# Patient Record
Sex: Female | Born: 1955 | Race: Black or African American | Hispanic: No | Marital: Married | Smoking: Current every day smoker
Health system: Southern US, Community
[De-identification: ages and names within clinical notes are randomized; demographics above are authoritative.]

## PROBLEM LIST (undated history)

## (undated) DIAGNOSIS — I10 Essential (primary) hypertension: Secondary | ICD-10-CM

## (undated) DIAGNOSIS — F17200 Nicotine dependence, unspecified, uncomplicated: Secondary | ICD-10-CM

## (undated) DIAGNOSIS — E669 Obesity, unspecified: Secondary | ICD-10-CM

## (undated) DIAGNOSIS — E785 Hyperlipidemia, unspecified: Secondary | ICD-10-CM

## (undated) HISTORY — DX: Obesity, unspecified: E66.9

## (undated) HISTORY — DX: Nicotine dependence, unspecified, uncomplicated: F17.200

## (undated) HISTORY — DX: Hyperlipidemia, unspecified: E78.5

## (undated) HISTORY — DX: Essential (primary) hypertension: I10

---

## 1976-03-01 DIAGNOSIS — F17200 Nicotine dependence, unspecified, uncomplicated: Secondary | ICD-10-CM

## 1976-03-01 HISTORY — DX: Nicotine dependence, unspecified, uncomplicated: F17.200

## 1991-03-02 DIAGNOSIS — N189 Chronic kidney disease, unspecified: Secondary | ICD-10-CM

## 1991-03-02 DIAGNOSIS — I10 Essential (primary) hypertension: Secondary | ICD-10-CM

## 1991-03-02 HISTORY — DX: Essential (primary) hypertension: I10

## 1991-03-02 HISTORY — DX: Chronic kidney disease, unspecified: N18.9

## 2000-03-01 HISTORY — PX: PARTIAL HYSTERECTOMY: SHX80

## 2001-08-14 ENCOUNTER — Other Ambulatory Visit: Admission: RE | Admit: 2001-08-14 | Discharge: 2001-08-14 | Payer: Self-pay | Admitting: Unknown Physician Specialty

## 2004-05-07 ENCOUNTER — Ambulatory Visit: Payer: Self-pay | Admitting: Family Medicine

## 2004-06-15 ENCOUNTER — Ambulatory Visit: Payer: Self-pay | Admitting: Family Medicine

## 2004-07-06 ENCOUNTER — Ambulatory Visit: Payer: Self-pay | Admitting: Family Medicine

## 2005-12-23 ENCOUNTER — Ambulatory Visit: Payer: Self-pay | Admitting: Family Medicine

## 2006-07-20 ENCOUNTER — Ambulatory Visit: Payer: Self-pay | Admitting: Family Medicine

## 2006-08-31 ENCOUNTER — Ambulatory Visit: Payer: Self-pay | Admitting: Family Medicine

## 2007-01-09 ENCOUNTER — Ambulatory Visit: Payer: Self-pay | Admitting: Family Medicine

## 2007-02-28 ENCOUNTER — Ambulatory Visit: Payer: Self-pay | Admitting: Family Medicine

## 2007-07-03 ENCOUNTER — Ambulatory Visit: Payer: Self-pay | Admitting: Family Medicine

## 2007-07-07 DIAGNOSIS — F17200 Nicotine dependence, unspecified, uncomplicated: Secondary | ICD-10-CM | POA: Insufficient documentation

## 2007-07-07 DIAGNOSIS — E663 Overweight: Secondary | ICD-10-CM | POA: Insufficient documentation

## 2007-07-07 DIAGNOSIS — I10 Essential (primary) hypertension: Secondary | ICD-10-CM | POA: Insufficient documentation

## 2007-07-07 DIAGNOSIS — I129 Hypertensive chronic kidney disease with stage 1 through stage 4 chronic kidney disease, or unspecified chronic kidney disease: Secondary | ICD-10-CM | POA: Insufficient documentation

## 2008-01-17 ENCOUNTER — Ambulatory Visit: Payer: Self-pay | Admitting: Family Medicine

## 2008-01-17 DIAGNOSIS — R5381 Other malaise: Secondary | ICD-10-CM | POA: Insufficient documentation

## 2008-01-17 DIAGNOSIS — R5383 Other fatigue: Secondary | ICD-10-CM

## 2008-04-12 ENCOUNTER — Encounter: Payer: Self-pay | Admitting: Family Medicine

## 2008-08-29 ENCOUNTER — Ambulatory Visit: Payer: Self-pay | Admitting: Family Medicine

## 2008-08-29 DIAGNOSIS — B369 Superficial mycosis, unspecified: Secondary | ICD-10-CM | POA: Insufficient documentation

## 2008-09-09 ENCOUNTER — Encounter: Payer: Self-pay | Admitting: Family Medicine

## 2008-09-10 ENCOUNTER — Encounter: Payer: Self-pay | Admitting: Family Medicine

## 2008-10-01 ENCOUNTER — Ambulatory Visit: Payer: Self-pay | Admitting: Family Medicine

## 2008-10-31 ENCOUNTER — Telehealth: Payer: Self-pay | Admitting: Family Medicine

## 2008-12-16 ENCOUNTER — Ambulatory Visit: Payer: Self-pay | Admitting: Family Medicine

## 2009-03-03 ENCOUNTER — Telehealth: Payer: Self-pay | Admitting: Family Medicine

## 2009-06-02 ENCOUNTER — Ambulatory Visit: Payer: Self-pay | Admitting: Family Medicine

## 2009-06-02 DIAGNOSIS — G5600 Carpal tunnel syndrome, unspecified upper limb: Secondary | ICD-10-CM | POA: Insufficient documentation

## 2009-09-08 ENCOUNTER — Ambulatory Visit: Payer: Self-pay | Admitting: Family Medicine

## 2010-01-01 ENCOUNTER — Ambulatory Visit: Payer: Self-pay | Admitting: Family Medicine

## 2010-01-01 DIAGNOSIS — M6530 Trigger finger, unspecified finger: Secondary | ICD-10-CM | POA: Insufficient documentation

## 2010-01-01 DIAGNOSIS — M79609 Pain in unspecified limb: Secondary | ICD-10-CM | POA: Insufficient documentation

## 2010-01-11 ENCOUNTER — Telehealth: Payer: Self-pay | Admitting: Family Medicine

## 2010-01-13 ENCOUNTER — Encounter: Payer: Self-pay | Admitting: Family Medicine

## 2010-03-22 ENCOUNTER — Encounter: Payer: Self-pay | Admitting: Family Medicine

## 2010-03-22 ENCOUNTER — Encounter: Payer: Self-pay | Admitting: General Surgery

## 2010-03-31 NOTE — Assessment & Plan Note (Signed)
Summary: OV   Vital Signs:  Patient profile:   55 year old female Menstrual status:  postmenopausal Height:      63 inches Weight:      185.75 pounds BMI:     33.02 O2 Sat:      97 % Pulse rate:   106 / minute Pulse rhythm:   regular Resp:     16 per minute BP sitting:   110 / 80  (left arm) Cuff size:   large  Vitals Entered By: Kate Sable LPN (July 11, 624THL [id number] AM)  Nutrition Counseling: Patient's BMI is greater than 25 and therefore counseled on weight management options. CC: Follow up chronic problems   CC:  Follow up chronic problems.  History of Present Illness: Reports  that she has been  doing well. Denies recent fever or chills. Denies sinus pressure, nasal congestion , ear pain or sore throat. Denies chest congestion, or cough productive of sputum. Denies chest pain, palpitations, PND, orthopnea or leg swelling. Denies abdominal pain, nausea, vomitting, diarrhea or constipation. Denies change in bowel movements or bloody stool. Denies dysuria , frequency, incontinence or hesitancy. Denies  joint pain, swelling, or reduced mobility. Denies headaches, vertigo, seizures. Denies depression, anxiety or insomnia. Denies  rash, lesions, or itch. she exercises 3 days per week, she is not as diligent with her diet , and misses the phentermine, she wants to resume it. she smokes on avg 3 ciggs daily     Preventive Screening-Counseling & Management  Alcohol-Tobacco     Smoking Cessation Counseling: yes  Current Medications (verified): 1)  Norvasc 10 Mg  Tabs (Amlodipine Besylate) .... Take 1 Tablet By Mouth Once A Day 2)  Maxzide 75-50 Mg  Tabs (Triamterene-Hctz) .... Take 1 Tablet By Mouth Once A Day 3)  Benazepril Hcl 20 Mg Tabs (Benazepril Hcl) .... Take 1 Tablet By Mouth Once A Day  Allergies (verified): 1)  ! Ace Inhibitors  Review of Systems      See HPI General:  Complains of fatigue; denies chills and fever. Eyes:  Complains of vision loss-both  eyes; denies discharge, eye pain, and red eye; wears corrective lenses. Endo:  Denies cold intolerance, excessive hunger, excessive thirst, excessive urination, and heat intolerance. Heme:  Denies abnormal bruising and bleeding. Allergy:  Denies hives or rash and itching eyes.  Physical Exam  General:  Well-developed,well-nourished,in no acute distress; alert,appropriate and cooperative throughout examination HEENT: No facial asymmetry,  EOMI, No sinus tenderness, TM's Clear, oropharynx  pink and moist.   Chest: Clear to auscultation bilaterally.  CVS: S1, S2, No murmurs, No S3.   Abd: Soft, Nontender.  MS: Adequate ROM spine, hips, shoulders and knees.POSITIVE TINNEL'S SIGN, THENAR WASTING BILATERALLY   Ext: No edema.   CNS: CN 2-12 intact, power tone and sensation normal throughout.   Skin: Intact, no visible lesions or rashes.  Psych: Good eye contact, normal affect.  Memory intact, not anxious or depressed appearing.     Impression & Recommendations:  Problem # 1:  NICOTINE ADDICTION (ICD-305.1) Assessment Unchanged  Encouraged smoking cessation and discussed different methods for smoking cessation.   Problem # 2:  HYPERTENSION (ICD-401.9) Assessment: Unchanged  The following medications were removed from the medication list:    Clonidine Hcl 0.1 Mg Tabs (Clonidine hcl) .Marland Kitchen... Take 1 tab by mouth at bedtime Her updated medication list for this problem includes:    Norvasc 10 Mg Tabs (Amlodipine besylate) .Marland Kitchen... Take 1 tablet by mouth once a day  Maxzide 75-50 Mg Tabs (Triamterene-hctz) .Marland Kitchen... Take 1 tablet by mouth once a day    Benazepril Hcl 20 Mg Tabs (Benazepril hcl) .Marland Kitchen... Take 1 tablet by mouth once a day  Orders: T-Basic Metabolic Panel ([id number])  BP today: 110/80 Prior BP: 114/80 (06/02/2009)  Problem # 3:  OBESITY (ICD-278.00) Assessment: Deteriorated  Ht: 63 (09/08/2009)   Wt: 185.75 (09/08/2009)   BMI: 33.02 (09/08/2009), pt to start phentermine daily  and inc physical activity  Complete Medication List: 1)  Norvasc 10 Mg Tabs (Amlodipine besylate) .... Take 1 tablet by mouth once a day 2)  Maxzide 75-50 Mg Tabs (Triamterene-hctz) .... Take 1 tablet by mouth once a day 3)  Benazepril Hcl 20 Mg Tabs (Benazepril hcl) .... Take 1 tablet by mouth once a day 4)  Phentermine Hcl 37.5 Mg Tabs (Phentermine hcl) .... Take 1 tablet by mouth once a day  Other Orders: T-Lipid Profile [id number]) T-CBC w/Diff [id number]) Radiology Referral (Radiology)  Patient Instructions: 1)  Please schedule a follow-up appointment in 2 months. 2)  Tobacco is very bad for your health and your loved ones! You Should stop smoking!. 3)  Stop Smoking Tips: Choose a Quit date. Cut down before the Quit date. decide what you will do as a substitute when you feel the urge to smoke(gum,toothpick,exercise).Max of 2 ciggs/day 4)  It is important that you exercise regularly at least 30 minutes 5 times a week. If you develop chest pain, have severe difficulty breathing, or feel very tired , stop exercising immediately and seek medical attention. 5)  You need to lose weight. Consider a lower calorie diet and regular exercise. Five pound weight loss 6)  BMP prior to visit, ICD-9: 7)  Lipid Panel prior to visit, ICD-9:  fasting as soon as possible 8)  CBC w/ Diff prior to visit, ICD-9: 9)  We will schedule your mamo. 10)  Pls sched your colonscopy 11)  Your blood pressure is excerllebnt  Prescriptions: BENAZEPRIL HCL 20 MG TABS (BENAZEPRIL HCL) Take 1 tablet by mouth once a day  #30 x 4   Entered by:   Kate Sable LPN   Authorized by:   Tula Nakayama MD   Signed by:   Kate Sable LPN on D34-534   Method used:   Electronically to        [facility] (retail)       [place], [place]  [address]       Ph: [phone] or [phone]       Fax: [phone]   RxID:   [id number] MAXZIDE 75-50 MG  TABS  (TRIAMTERENE-HCTZ) Take 1 tablet by mouth once a day  #30 x 4   Entered by:   Kate Sable LPN   Authorized by:   Tula Nakayama MD   Signed by:   Kate Sable LPN on D34-534   Method used:   Electronically to        [facility] (retail)       [address]       [place], [place]  [address]       Ph: [phone] or [phone]       Fax: [phone]   RxID:   [id number] NORVASC 10 MG  TABS (AMLODIPINE BESYLATE) Take 1 tablet by mouth once a day  #30 x 4  Entered by:   Kate Sable LPN   Authorized by:   Tula Nakayama MD   Signed by:   Kate Sable LPN on D34-534   Method used:   Electronically to        [facility] (retail)       [address]       [place], [place]  [address]       Ph: [phone] or [phone]       Fax: [phone]   RxID:   [id number] PHENTERMINE HCL 37.5 MG TABS (PHENTERMINE HCL) Take 1 tablet by mouth once a day  #30 x 1   Entered and Authorized by:   Tula Nakayama MD   Signed by:   Tula Nakayama MD on 09/08/2009   Method used:   Printed then faxed to ...       [facility] (retail)       [address]       [place], [place]  [address]       Ph: [phone] or [phone]       Fax: [phone]   RxID:   [id number]

## 2010-03-31 NOTE — Assessment & Plan Note (Signed)
Summary: office visit   Vital Signs:  Patient profile:   55 year old female Menstrual status:  postmenopausal Height:      63 inches Weight:      186.75 pounds BMI:     33.20 O2 Sat:      98 % on Room air Pulse rate:   92 / minute Pulse rhythm:   regular Resp:     16 per minute BP sitting:   122 / 84  (right arm)  Vitals Entered By: Louie Casa CMA (January 01, 2010 8:56 AM)  Nutrition Counseling: Patient's BMI is greater than 25 and therefore counseled on weight management options.  O2 Flow:  Room air CC: Patient states right thumb hurts for past 6 months, she thinks it is carpal tunnel. Fatigued   CC:  Patient states right thumb hurts for past 6 months and she thinks it is carpal tunnel. Fatigued.  History of Present Illness: Reports  that she has not been doing very well. She reports chronic fatigue She is not exercising regularly and therefore her weight is unchanged. Denies recent fever or chills. Denies sinus pressure, nasal congestion , ear pain or sore throat. Denies chest congestion, or cough productive of sputum. Denies chest pain, palpitations, PND, orthopnea or leg swelling. Denies abdominal pain, nausea, vomitting, diarrhea or constipation. Denies change in bowel movements or bloody stool. Denies dysuria , frequency, incontinence or hesitancy.  Denies headaches, vertigo, seizures. Denies depression, anxiety or insomnia. Denies  rash, lesions, or itch. she is still smoking though a little less, still wants to quit.     Current Medications (verified): 1)  Norvasc 10 Mg  Tabs (Amlodipine Besylate) .... Take 1 Tablet By Mouth Once A Day 2)  Maxzide 75-50 Mg  Tabs (Triamterene-Hctz) .... Take 1 Tablet By Mouth Once A Day 3)  Benazepril Hcl 20 Mg Tabs (Benazepril Hcl) .... Take 1 Tablet By Mouth Once A Day 4)  Phentermine Hcl 37.5 Mg Tabs (Phentermine Hcl) .... Take 1 Tablet By Mouth Once A Day  Allergies (verified): 1)  ! Ace Inhibitors  Review of  Systems      See HPI General:  Complains of fatigue and malaise. Eyes:  Complains of vision loss-both eyes; denies discharge and red eye. MS:  Complains of joint pain and stiffness; right thumb pain and tenderness and swelling with decreased mobility, also has reduced strength in the hands from carpal tunnel . Endo:  Denies cold intolerance, excessive thirst, excessive urination, and heat intolerance. Heme:  Denies abnormal bruising and bleeding. Allergy:  Complains of seasonal allergies.  Physical Exam  General:  Well-developed,well-nourished,in no acute distress; alert,appropriate and cooperative throughout examination HEENT: No facial asymmetry,  EOMI, No sinus tenderness, TM's Clear, oropharynx  pink and moist.   Chest: Clear to auscultation bilaterally.  CVS: S1, S2, No murmurs, No S3.   Abd: Soft, Nontender.  MS: Adequate ROM spine, hips, shoulders and knees.POSITIVE TINNEL'S SIGN, THENAR WASTING BILATERALLY  . Right thenar eminence tender with selling and reduced mobility of the thumb which also triggers Ext: No edema.   CNS: CN 2-12 intact, power tone and sensation normal throughout.   Skin: Intact, no visible lesions or rashes.  Psych: Good eye contact, normal affect.  Memory intact, not anxious or depressed appearing.     Impression & Recommendations:  Problem # 1:  TRIGGER FINGER, RIGHT THUMB (ICD-727.03) Assessment Deteriorated  Orders: Orthopedic Referral (Ortho)  Problem # 2:  CARPAL TUNNEL SYNDROME (ICD-354.0) Assessment: Deteriorated  Problem #  3:  NICOTINE ADDICTION (ICD-305.1) Assessment: Unchanged  Encouraged smoking cessation and discussed different methods for smoking cessation.   Problem # 4:  HYPERTENSION (ICD-401.9) Assessment: Unchanged  The following medications were removed from the medication list:    Maxzide 75-50 Mg Tabs (Triamterene-hctz) .Marland Kitchen... Take 1 tablet by mouth once a day Her updated medication list for this problem includes:     Norvasc 10 Mg Tabs (Amlodipine besylate) .Marland Kitchen... Take 1 tablet by mouth once a day    Benazepril Hcl 20 Mg Tabs (Benazepril hcl) .Marland Kitchen... Take 1 tablet by mouth once a day  BP today: 122/84 Prior BP: 110/80 (09/08/2009)  Problem # 5:  OBESITY (ICD-278.00) Assessment: Unchanged  Ht: 63 (01/01/2010)   Wt: 186.75 (01/01/2010)   BMI: 33.20 (01/01/2010)  Complete Medication List: 1)  Norvasc 10 Mg Tabs (Amlodipine besylate) .... Take 1 tablet by mouth once a day 2)  Benazepril Hcl 20 Mg Tabs (Benazepril hcl) .... Take 1 tablet by mouth once a day 3)  Phentermine Hcl 37.5 Mg Tabs (Phentermine hcl) .... Take 1 tablet by mouth once a day 4)  Prednisone (pak) 5 Mg Tabs (Prednisone) .... Use as directed 5)  Ibuprofen 800 Mg Tabs (Ibuprofen) .... Take 1 tablet by mouth three times a day  Other Orders: Influenza Vaccine NON MCR [id number]) Depo- Medrol 80mg  (J1040) Ketorolac-Toradol 15mg  [id number]) Admin of Therapeutic Inj  intramuscular or subcutaneous [id number])  Patient Instructions: 1)  Please schedule a follow-up appointment in 4 months. 2)  It is important that you exercise regularly at least 30 minutes 5 times a week. If you develop chest pain, have severe difficulty breathing, or feel very tired , stop exercising immediately and seek medical attention. 3)  You need to lose weight. Consider a lower calorie diet and regular exercise. Goal is 8 pounds 4)  You will get injections for your right hand pain and meds have been sent in. 5)  Left ear flush today. 6)  You are referred to ortho re the thumb. 7)  Schedule a colonoscopy/sigmoidoscopy to help detect colon cancer. Prescriptions: IBUPROFEN 800 MG TABS (IBUPROFEN) Take 1 tablet by mouth three times a day  #21 x 0   Entered and Authorized by:   Tula Nakayama MD   Signed by:   Tula Nakayama MD on 01/01/2010   Method used:   Electronically to        [facility] (retail)       [address]       [place], [place]  [address]       Ph: [phone] or [phone]       Fax: [phone]   RxID:   [id number] PREDNISONE (PAK) 5 MG TABS (PREDNISONE) Use as directed  #21 x 0   Entered and Authorized by:   Tula Nakayama MD   Signed by:   Tula Nakayama MD on 01/01/2010   Method used:   Electronically to        [facility]* (retail)       [address]       [place], [place]  [address]       Ph: [phone] or [phone]       Fax: [phone]   RxID:   [id number] PHENTERMINE HCL 37.5 MG TABS (PHENTERMINE HCL) Take 1 tablet by mouth once a day  #30 x 1   Entered  and Authorized by:   Tula Nakayama MD   Signed by:   Tula Nakayama MD on 01/01/2010   Method used:   Printed then faxed to ...       [facility]* (retail)       [address]       [place], [place]  [address]       Ph: [phone] or [phone]       Fax: [phone]   RxID:   [id number]    Medication Administration  Injection # 1:    Medication: Depo- Medrol 80mg     Diagnosis: HAND PAIN, RIGHT (ICD-729.5)    Route: IM    Site: RUOQ gluteus    Exp Date: 07/12    Lot #: Marily Lente    Mfr: Pharmacia    Patient tolerated injection without complications    Given by: Baldomero Lamy LPN (November  3, 624THL 10:08 AM)  Injection # 2:    Medication: Ketorolac-Toradol 15mg     Diagnosis: HAND PAIN, RIGHT (ICD-729.5)    Route: IM    Site: LUOQ gluteus    Exp Date: 12/31/2010    Lot #: [id number]    Mfr: [email or web address]    Comments: toradol 60mg  given    Patient tolerated injection without complications    Given by: Baldomero Lamy LPN (November  3, 624THL 10:09 AM)  Orders Added: 1)  Influenza Vaccine NON MCR [00028] 2)  Est. Patient Level IV [id number] 3)  Depo- Medrol 80mg  [J1040] 4)  Ketorolac-Toradol 15mg  [J1885] 5)  Admin of Therapeutic Inj  intramuscular or subcutaneous [96372] 6)  Orthopedic Referral [Ortho] 7)   Orthopedic Referral [Ortho]   Immunizations Administered:  Influenza Vaccine:    Vaccine Type: Fluvax Non-MCR    Site: right deltoid    Mfr: novartis    Dose: 0.5 ml    Route: IM    Given by: Louie Casa CMA    Exp. Date: 06/2010    Lot #: [id number] 5p    VIS given: 09/23/09 version given January 01, 2010.   Immunizations Administered:  Influenza Vaccine:    Vaccine Type: Fluvax Non-MCR    Site: right deltoid    Mfr: novartis    Dose: 0.5 ml    Route: IM    Given by: Louie Casa CMA    Exp. Date: 06/2010    Lot #: [id number] 5p    VIS given: 09/23/09 version given January 01, 2010.   Medication Administration  Injection # 1:    Medication: Depo- Medrol 80mg     Diagnosis: HAND PAIN, RIGHT (ICD-729.5)    Route: IM    Site: RUOQ gluteus    Exp Date: 07/12    Lot #: Marily Lente    Mfr: Pharmacia    Patient tolerated injection without complications    Given by: Baldomero Lamy LPN (November  3, 624THL 10:08 AM)  Injection # 2:    Medication: Ketorolac-Toradol 15mg     Diagnosis: HAND PAIN, RIGHT (ICD-729.5)    Route: IM    Site: LUOQ gluteus    Exp Date: 12/31/2010    Lot #: [id number]    Mfr: novaplus    Comments: toradol 60mg  given    Patient tolerated injection without complications    Given by: Baldomero Lamy LPN (November  3, 624THL 10:09 AM)  Orders Added: 1)  Influenza Vaccine NON MCR [00028] 2)  Est. Patient Level IV [id number] 3)  Depo- Medrol  80mg  [J1040] 4)  Ketorolac-Toradol 15mg  [J1885] 5)  Admin of Therapeutic Inj  intramuscular or subcutaneous [96372] 6)  Orthopedic Referral [Ortho] 7)  Orthopedic Referral [Ortho]  attempted left ear irriagation, unsuccessful, patient advised to use wax softner before next visit Baldomero Lamy LPN  November  3, 624THL 10:10 AM

## 2010-03-31 NOTE — Progress Notes (Signed)
  Phone Note Other Incoming   Caller: dr simpson Summary of Call: Labs of 01/01/10 show elevated fBG, please advise the pt that HER fasting blood sugar is  higher than normal.  She needs  to cut back on carbs and sweets, start regular exercise, target at least 30 mins 5 days weekly, and aim for weight loss.  This will reduce the risk of becoming a diabetic  or delay it's development , she also needs HBA1C asap  Please advise pt the cholesterol is elevated. They need to increase fresh or frozen fruit and vegetable intake, reduce red meats max twice weekly, increase white meat, baked, grilled or broiled eg fish, [place] breast, chicken breast.  Egg white boiled is an excellent source of protein and beans cooked without butter or oil. Work towards exercising at least 5 days/week for 30 minutes each session, to improve the good cholesterol. Cut back on butter, cheese, oils and the yellow of eggs, preferably drink 1% or skimmed milk. The healthy oils are olive and canola, use in moderation; healthiest "butter" substitute is smart balance. , total chol 216, and LDL high also at 112, thes inc her risk of heRT DISEASE. SHE REALLY NEEDS TO QUIT SMOKING AND ENSURE HER BLOOD PRESSURE IS NORMAL Initial call taken by: Tula Nakayama MD,  January 11, 2010 5:40 PM    patient aware Baldomero Lamy LPN  November 15, 624THL 2:38 PM

## 2010-03-31 NOTE — Assessment & Plan Note (Signed)
Summary: FOLLOW UP   Vital Signs:  Patient profile:   55 year old female Menstrual status:  postmenopausal Height:      63 inches Weight:      179 pounds BMI:     31.82 O2 Sat:      96 % Pulse rate:   65 / minute Pulse rhythm:   regular Resp:     16 per minute BP sitting:   114 / 80  (left arm) Cuff size:   large  Vitals Entered By: Kate Sable LPN (April  4, 624THL D34-534 AM)  Nutrition Counseling: Patient's BMI is greater than 25 and therefore counseled on weight management options. CC: has been having a bad ace cough, no energy at all   CC:  has been having a bad ace cough and no energy at all.  History of Present Illness: Reports  that she hs been  doing well. She does c/o seveerre bilateral hand pain with weakness. Denies recent fever or chills. Denies sinus pressure, nasal congestion , ear pain or sore throat. Denies chest congestion,c/o chronic cough , non productive , with tickle in throat, likely due to ace. Denies chest pain, palpitations, PND, orthopnea or leg swelling. Denies abdominal pain, nausea, vomitting, diarrhea or constipation. Denies change in bowel movements or bloody stool. Denies dysuria , frequency, incontinence or hesitancy. Denies  joint pain, swelling, or reduced mobility. Denies headaches, vertigo, seizures. Denies depression, anxiety or insomnia. Denies  rash, lesions, or itch. She is still smoking, but waNTS TO QUIT.      Preventive Screening-Counseling & Management  Alcohol-Tobacco     Smoking Cessation Counseling: yes  Current Medications (verified): 1)  Norvasc 10 Mg  Tabs (Amlodipine Besylate) .... Take 1 Tablet By Mouth Once A Day 2)  Maxzide 75-50 Mg  Tabs (Triamterene-Hctz) .... Take 1 Tablet By Mouth Once A Day 3)  Clotrimazole-Betamethasone 1-0.05 % Crea (Clotrimazole-Betamethasone) .... Apply To Affected Areas Twice Daily As Needed  Allergies (verified): 1)  ! Ace Inhibitors  Past History:  Past Medical  History: NICOTINE ADDICTION (ICD-305.1) OBESITY (ICD-278.00) HYPERTENSION (ICD-401.9) hyperlipidemioa  Family History:  MOTHER DECEASED BREAST CANCER / HYPERTENSION / STROKE FATHER  DECEASED / HYPERTENSION / STROKE /  DIABETIC THREE BROTHERS LIVING / two DIABETIC / THREE HYPERTENSION  Social History: HAIRDRESSER Married Current Smoker, current 2 to 4 per day Alcohol use-yes, cutting back Drug use-no One child adult, healthy  Review of Systems      See HPI Eyes:  Denies blurring and discharge. MS:  Complains of joint pain, muscle weakness, and stiffness. Heme:  Denies abnormal bruising and bleeding. Allergy:  Complains of seasonal allergies.  Physical Exam  General:  Well-developed,well-nourished,in no acute distress; alert,appropriate and cooperative throughout examination HEENT: No facial asymmetry,  EOMI, No sinus tenderness, TM's Clear, oropharynx  pink and moist.   Chest: Clear to auscultation bilaterally.  CVS: S1, S2, No murmurs, No S3.   Abd: Soft, Nontender.  MS: Adequate ROM spine, hips, shoulders and knees.POSITIVE TINNEL'S SIGN, THENAR WASTING BILATERALLY   Ext: No edema.   CNS: CN 2-12 intact, power tone and sensation normal throughout.   Skin: Intact, no visible lesions or rashes.  Psych: Good eye contact, normal affect.  Memory intact, not anxious or depressed appearing.     Impression & Recommendations:  Problem # 1:  CARPAL TUNNEL SYNDROME (ICD-354.0) Assessment Deteriorated  Orders: Neurology Referral (Neuro)  Problem # 2:  DERMATOMYCOSIS (ICD-111.9) Assessment: Improved  Her updated medication list for this problem  includes:    Clotrimazole-betamethasone 1-0.05 % Crea (Clotrimazole-betamethasone) .Marland Kitchen... Apply to affected areas twice daily as needed  Problem # 3:  NICOTINE ADDICTION (ICD-305.1) Assessment: Unchanged  Encouraged smoking cessation and discussed different methods for smoking cessation.   Problem # 4:  HYPERTENSION  (ICD-401.9) Assessment: Unchanged  The following medications were removed from the medication list:    Lotensin 20 Mg Tabs (Benazepril hcl) .Marland Kitchen... Take 1 tablet by mouth once a day Her updated medication list for this problem includes:    Norvasc 10 Mg Tabs (Amlodipine besylate) .Marland Kitchen... Take 1 tablet by mouth once a day    Maxzide 75-50 Mg Tabs (Triamterene-hctz) .Marland Kitchen... Take 1 tablet by mouth once a day    Clonidine Hcl 0.1 Mg Tabs (Clonidine hcl) .Marland Kitchen... Take 1 tab by mouth at bedtime, med changed due to cough likely from ace intolerance  Orders: T-Basic Metabolic Panel (99991111)  BP today: 114/80 Prior BP: 112/82 (12/16/2008)  Problem # 5:  OBESITY (ICD-278.00) Assessment: Unchanged  Ht: 63 (06/02/2009)   Wt: 179 (06/02/2009)   BMI: 31.82 (06/02/2009)  Complete Medication List: 1)  Norvasc 10 Mg Tabs (Amlodipine besylate) .... Take 1 tablet by mouth once a day 2)  Maxzide 75-50 Mg Tabs (Triamterene-hctz) .... Take 1 tablet by mouth once a day 3)  Clotrimazole-betamethasone 1-0.05 % Crea (Clotrimazole-betamethasone) .... Apply to affected areas twice daily as needed 4)  Clonidine Hcl 0.1 Mg Tabs (Clonidine hcl) .... Take 1 tab by mouth at bedtime 5)  Phentermine Hcl 37.5 Mg Caps (Phentermine hcl) .... Take 1 tablet by mouth once a day  Other Orders: T-Hepatic Function [id number]) T-Lipid Profile [id number])  Patient Instructions: 1)  Please schedule a follow-up appointment in 3 months. 2)  Tobacco is very bad for your health and your loved ones! You Should stop smoking!. 3)  Stop Smoking Tips: Choose a Quit date. Cut down before the Quit date. decide what you will do as a substitute when you feel the urge to smoke(gum,toothpick,exercise). 4)  It is important that you exercise regularly at least 20 minutes 5 times a week. If you develop chest pain, have severe difficulty breathing, or feel very tired , stop exercising immediately and seek medical attention. 5)  You need to lose  weight. Consider a lower calorie diet and regular exercise.  6)  BMP prior to visit, ICD-9: 7)  Hepatic Panel prior to visit, ICD-9: fasting asap 8)  Lipid Panel prior to visit, ICD-9: 9)  you will be referred  for nerve conduction tests for carpal tunnel synd and to Dr. Merlene Laughter  for injections Prescriptions: CLOTRIMAZOLE-BETAMETHASONE 1-0.05 % CREA (CLOTRIMAZOLE-BETAMETHASONE) apply to affected areas twice daily as needed  #30 gm x 2   Entered by:   Kate Sable LPN   Authorized by:   Tula Nakayama MD   Signed by:   Kate Sable LPN on QA348G   Method used:   Electronically to        [facility] (retail)       [place], [place]  [address]       Ph: [phone] or [phone]       Fax: [phone]   RxID:   [id number] PHENTERMINE HCL 37.5 MG CAPS (PHENTERMINE HCL) Take 1 tablet by mouth once a day  #30 x 1   Entered by:   Kate Sable LPN   Authorized by:   Joycelyn Schmid  Simpson MD   Signed by:   Kate Sable LPN on QA348G   Method used:   Printed then faxed to ...       [facility] (retail)       [address]       [place], [place]  [address]       Ph: [phone] or [phone]       Fax: [phone]   RxID:   [id number] CLONIDINE HCL 0.1 MG TABS (CLONIDINE HCL) Take 1 tab by mouth at bedtime  #30 x 4   Entered and Authorized by:   Tula Nakayama MD   Signed by:   Tula Nakayama MD on 06/02/2009   Method used:   Electronically to        [facility] (retail)       [address]       [place],   [address]       Ph: [phone] or [phone]       Fax: [phone]   RxID:   [id number]

## 2010-03-31 NOTE — Progress Notes (Signed)
Summary: BP MEDS  Phone Note Call from Patient   Summary of Call: NEEDS HER BP MEDS FAXED BACK IN ASAP CLOSE TODAY AT 5:30 OR 6:00 [place] Initial call taken by: Dierdre Harness,  March 03, 2009 4:29 PM    Prescriptions: NORVASC 10 MG  TABS (AMLODIPINE BESYLATE) Take 1 tablet by mouth once a day  #30 x 4   Entered by:   Kate Sable   Authorized by:   Tula Nakayama MD   Signed by:   Kate Sable on 03/04/2009   Method used:   Electronically to        [facility] (retail)       [place], [place]  [address]       Ph: [phone] or [phone]       Fax: [phone]   RxID:   [id number] MAXZIDE 75-50 MG  TABS (TRIAMTERENE-HCTZ) Take 1 tablet by mouth once a day  #30 x 4   Entered by:   Kate Sable   Authorized by:   Tula Nakayama MD   Signed by:   Kate Sable on 03/04/2009   Method used:   Electronically to        [facility] (retail)       [place], [place]  [address]       Ph: [phone] or [phone]       Fax: [phone]   RxID:   [id number] LOTENSIN 20 MG TABS (BENAZEPRIL HCL) Take 1 tablet by mouth once a day  #30 x 4   Entered by:   Kate Sable   Authorized by:   Tula Nakayama MD   Signed by:   Kate Sable on 03/04/2009   Method used:   Electronically to        [facility] (retail)       [address]       [place], [place]  [address]       Ph: [phone] or [phone]       Fax: [phone]   RxID:   [id number]

## 2010-05-04 ENCOUNTER — Ambulatory Visit: Payer: Self-pay | Admitting: Family Medicine

## 2010-05-05 ENCOUNTER — Ambulatory Visit (INDEPENDENT_AMBULATORY_CARE_PROVIDER_SITE_OTHER): Payer: PRIVATE HEALTH INSURANCE | Admitting: Family Medicine

## 2010-05-05 ENCOUNTER — Other Ambulatory Visit: Payer: Self-pay | Admitting: Family Medicine

## 2010-05-05 ENCOUNTER — Encounter: Payer: Self-pay | Admitting: Family Medicine

## 2010-05-05 DIAGNOSIS — E669 Obesity, unspecified: Secondary | ICD-10-CM

## 2010-05-05 DIAGNOSIS — F17200 Nicotine dependence, unspecified, uncomplicated: Secondary | ICD-10-CM

## 2010-05-05 DIAGNOSIS — I10 Essential (primary) hypertension: Secondary | ICD-10-CM

## 2010-05-06 LAB — BASIC METABOLIC PANEL
CO2: 25 mEq/L (ref 19–32)
Chloride: 105 mEq/L (ref 96–112)
Creat: 1.87 mg/dL — ABNORMAL HIGH (ref 0.40–1.20)
Potassium: 3.9 mEq/L (ref 3.5–5.3)
Sodium: 141 mEq/L (ref 135–145)

## 2010-05-06 LAB — HEMOGLOBIN A1C: Mean Plasma Glucose: 128 mg/dL — ABNORMAL HIGH (ref <117)

## 2010-05-07 ENCOUNTER — Telehealth (INDEPENDENT_AMBULATORY_CARE_PROVIDER_SITE_OTHER): Payer: Self-pay | Admitting: *Deleted

## 2010-05-11 LAB — CONVERTED CEMR LAB
CO2: 25 meq/L (ref 19–32)
Chloride: 105 meq/L (ref 96–112)
Glucose, Bld: 110 mg/dL — ABNORMAL HIGH (ref 70–99)
Potassium: 3.9 meq/L (ref 3.5–5.3)
Sodium: 141 meq/L (ref 135–145)

## 2010-05-12 NOTE — Progress Notes (Signed)
Summary: referral  Phone Note Call from Patient   Summary of Call: pt has appt to get mammo at [facility] for 05/15/2010 9:00. let message for pt about appt and time. And if she had any questions to call office with them.  Initial call taken by: Lenn Cal,  May 07, 2010 2:33 PM

## 2010-05-19 NOTE — Assessment & Plan Note (Signed)
Summary: follow up   Vital Signs:  Patient profile:   55 year old female Menstrual status:  postmenopausal Height:      63 inches Weight:      180 pounds BMI:     32.00 O2 Sat:      96 % Pulse rate:   91 / minute Pulse rhythm:   regular Resp:     16 per minute BP sitting:   120 / 78  (left arm) Cuff size:   large  Vitals Entered By: Kate Sable LPN (March  6, [id number] PM)  Nutrition Counseling: Patient's BMI is greater than 25 and therefore counseled on weight management options. CC: Follow up chronic problems   CC:  Follow up chronic problems.  History of Present Illness: Reports  that she is doing well. She is losing weight and is more commited to exercise, feels good about the results so far Denies recent fever or chills. Denies sinus pressure, nasal congestion , ear pain or sore throat. Denies chest congestion, or cough productive of sputum. Denies chest pain, palpitations, PND, orthopnea or leg swelling. Denies abdominal pain, nausea, vomitting, diarrhea or constipation. Denies change in bowel movements or bloody stool. Denies dysuria , frequency, incontinence or hesitancy. Denies  joint pain, swelling, or reduced mobility. Denies headaches, vertigo, seizures. Denies depression, anxiety or insomnia. Denies  rash, lesions, or itch.     Preventive Screening-Counseling & Management  Alcohol-Tobacco     Smoking Cessation Counseling: yes  Current Medications (verified): 1)  Norvasc 10 Mg  Tabs (Amlodipine Besylate) .... Take 1 Tablet By Mouth Once A Day 2)  Benazepril Hcl 20 Mg Tabs (Benazepril Hcl) .... Take 1 Tablet By Mouth Once A Day 3)  Phentermine Hcl 37.5 Mg Tabs (Phentermine Hcl) .... Take 1 Tablet By Mouth Once A Day 4)  Ibuprofen 800 Mg Tabs (Ibuprofen) .... Take 1 Tablet By Mouth Three Times A Day 5)  Maxzide 75-50 Mg Tabs (Triamterene-Hctz) .... One Tab By Mouth Once Daily  Allergies (verified): 1)  ! Ace Inhibitors  Review of Systems      See  HPI Eyes:  Complains of vision loss-both eyes. Psych:  Denies anxiety and depression. Endo:  Denies cold intolerance, excessive hunger, excessive thirst, and excessive urination. Heme:  Denies abnormal bruising, bleeding, and enlarge lymph nodes. Allergy:  Complains of seasonal allergies.  Physical Exam  General:  Well-developed,well-nourished,in no acute distress; alert,appropriate and cooperative throughout examination HEENT: No facial asymmetry,  EOMI, No sinus tenderness, TM's Clear, oropharynx  pink and moist.   Chest: Clear to auscultation bilaterally.  CVS: S1, S2, No murmurs, No S3.   Abd: Soft, Nontender.  MS: Adequate ROM spine, hips, shoulders and knees.  Ext: No edema.   CNS: CN 2-12 intact, power tone and sensation normal throughout.   Skin: Intact, no visible lesions or rashes.  Psych: Good eye contact, normal affect.  Memory intact, not anxious or depressed appearing.    Impression & Recommendations:  Problem # 1:  NICOTINE ADDICTION (ICD-305.1) Assessment Unchanged  Encouraged smoking cessation and discussed different methods for smoking cessation.   Problem # 2:  HYPERTENSION (ICD-401.9) Assessment: Unchanged  Her updated medication list for this problem includes:    Norvasc 10 Mg Tabs (Amlodipine besylate) .Marland Kitchen... Take 1 tablet by mouth once a day    Benazepril Hcl 20 Mg Tabs (Benazepril hcl) .Marland Kitchen... Take 1 tablet by mouth once a day    Maxzide 75-50 Mg Tabs (Triamterene-hctz) ..... One tab by mouth  once daily  Orders: T-Basic Metabolic Panel (99991111) T-Basic Metabolic Panel (99991111)  BP today: 120/78 Prior BP: 122/84 (01/01/2010)  Problem # 3:  OBESITY (ICD-278.00) Assessment: Improved  Ht: 63 (05/05/2010)   Wt: 180 (05/05/2010)   BMI: 32.00 (05/05/2010) therapeutic lifestyle change discussed and encouraged pt to continue phentermine as before  Complete Medication List: 1)  Norvasc 10 Mg Tabs (Amlodipine besylate) .... Take 1 tablet by  mouth once a day 2)  Benazepril Hcl 20 Mg Tabs (Benazepril hcl) .... Take 1 tablet by mouth once a day 3)  Ibuprofen 800 Mg Tabs (Ibuprofen) .... Take 1 tablet by mouth three times a day 4)  Maxzide 75-50 Mg Tabs (Triamterene-hctz) .... One tab by mouth once daily 5)  Phentermine Hcl 37.5 Mg Tabs (Phentermine hcl) .... Take 1 tablet by mouth once a day  Other Orders: Radiology other (Radiology Other) T- Hemoglobin A1C [id number]) T-Lipid Profile [id number]) Radiology Referral (Radiology)  Patient Instructions: 1)  Please schedule a cPE  in 4 months. 2)  Tobacco is very bad for your health and your loved ones! You Should stop smoking!. 3)  Stop Smoking Tips: Choose a Quit date. Cut down before the Quit date. decide what you will do as a substitute when you feel the urge to smoke(gum,toothpick,exercise). 4)  It is important that you exercise regularly at least 60 minutes 5 times a week. If you develop chest pain, have severe difficulty breathing, or feel very tired , stop exercising immediately and seek medical attention. 5)  You need to lose weight. Consider a lower calorie diet and regular exercise.  6)  BMP prior to visit, ICD-9: 7)  HbgA1C prior to visit, ICD-9:   today 8)  BMP prior to visit, ICD-9: 9)  Lipid Panel prior to visit, ICD-9:  fasting in 4 months Prescriptions: PHENTERMINE HCL 37.5 MG TABS (PHENTERMINE HCL) Take 1 tablet by mouth once a day  #30 x 1   Entered and Authorized by:   Tula Nakayama MD   Signed by:   Tula Nakayama MD on 05/05/2010   Method used:   Printed then faxed to ...       [facility]* (retail)       [address]       [place], [place]  [address]       Ph: [phone] or [phone]       Fax: [phone]   RxID:   [id number]    Orders Added: 1)  Radiology other [Radiology Other] 2)  Est. Patient Level IV [id number] 3)  T-Basic Metabolic Panel 0000000 4)  T- Hemoglobin A1C  [83036-23375] 5)  T-Basic Metabolic Panel 0000000 6)  T-Lipid Profile [80061-22930] 7)  Radiology Referral [Radiology]

## 2010-05-30 ENCOUNTER — Other Ambulatory Visit: Payer: Self-pay | Admitting: Family Medicine

## 2010-07-07 ENCOUNTER — Other Ambulatory Visit: Payer: Self-pay | Admitting: Family Medicine

## 2010-07-07 DIAGNOSIS — I10 Essential (primary) hypertension: Secondary | ICD-10-CM

## 2010-07-08 MED ORDER — AMLODIPINE BESYLATE 10 MG PO TABS: 10.0000 mg | ORAL_TABLET | Freq: Every day | ORAL | Status: DC

## 2010-07-08 MED ORDER — BENAZEPRIL HCL 20 MG PO TABS: 20.0000 mg | ORAL_TABLET | Freq: Every day | ORAL | Status: DC

## 2010-07-09 ENCOUNTER — Other Ambulatory Visit: Payer: Self-pay

## 2010-07-09 DIAGNOSIS — I10 Essential (primary) hypertension: Secondary | ICD-10-CM

## 2010-07-09 MED ORDER — AMLODIPINE BESYLATE 10 MG PO TABS: 10.0000 mg | ORAL_TABLET | Freq: Every day | ORAL | Status: DC

## 2010-09-09 ENCOUNTER — Encounter: Payer: Self-pay | Admitting: Family Medicine

## 2010-09-15 ENCOUNTER — Encounter: Payer: Self-pay | Admitting: Family Medicine

## 2010-09-15 ENCOUNTER — Ambulatory Visit (INDEPENDENT_AMBULATORY_CARE_PROVIDER_SITE_OTHER): Payer: PRIVATE HEALTH INSURANCE | Admitting: Family Medicine

## 2010-09-15 ENCOUNTER — Other Ambulatory Visit (HOSPITAL_COMMUNITY)
Admission: RE | Admit: 2010-09-15 | Discharge: 2010-09-15 | Disposition: A | Discharge status: Home / Self Care | Payer: PRIVATE HEALTH INSURANCE | Source: Ambulatory Visit | Attending: Family Medicine | Admitting: Family Medicine

## 2010-09-15 VITALS — BP 130/90 | HR 72 | Resp 16 | Ht 63.75 in | Wt 179.1 lb

## 2010-09-15 DIAGNOSIS — R7301 Impaired fasting glucose: Secondary | ICD-10-CM

## 2010-09-15 DIAGNOSIS — Z01419 Encounter for gynecological examination (general) (routine) without abnormal findings: Secondary | ICD-10-CM | POA: Insufficient documentation

## 2010-09-15 DIAGNOSIS — Z0000 Encounter for general adult medical examination without abnormal findings: Secondary | ICD-10-CM

## 2010-09-15 DIAGNOSIS — R1031 Right lower quadrant pain: Secondary | ICD-10-CM

## 2010-09-15 DIAGNOSIS — Z1211 Encounter for screening for malignant neoplasm of colon: Secondary | ICD-10-CM

## 2010-09-15 DIAGNOSIS — R5381 Other malaise: Secondary | ICD-10-CM

## 2010-09-15 DIAGNOSIS — R5383 Other fatigue: Secondary | ICD-10-CM

## 2010-09-15 DIAGNOSIS — F17200 Nicotine dependence, unspecified, uncomplicated: Secondary | ICD-10-CM

## 2010-09-15 DIAGNOSIS — E669 Obesity, unspecified: Secondary | ICD-10-CM

## 2010-09-15 DIAGNOSIS — I10 Essential (primary) hypertension: Secondary | ICD-10-CM

## 2010-09-15 MED ORDER — IBUPROFEN 800 MG PO TABS: 800.0000 mg | ORAL_TABLET | Freq: Three times a day (TID) | ORAL | Status: AC | PRN

## 2010-09-15 MED ORDER — PREDNISONE (PAK) 5 MG PO TABS: 5.0000 mg | ORAL_TABLET | ORAL | Status: DC

## 2010-09-15 MED ORDER — IBUPROFEN 800 MG PO TABS: 800.0000 mg | ORAL_TABLET | Freq: Three times a day (TID) | ORAL | Status: DC | PRN

## 2010-09-15 MED ORDER — METHYLPREDNISOLONE ACETATE 80 MG/ML IJ SUSP
80.0000 mg | Freq: Once | INTRAMUSCULAR | Status: AC
  Administered 2010-09-15: 80 mg via INTRAMUSCULAR

## 2010-09-15 MED ORDER — KETOROLAC TROMETHAMINE 30 MG/ML IJ SOLN
60.0000 mg | Freq: Once | INTRAMUSCULAR | Status: AC
  Administered 2010-09-15: 60 mg via INTRAMUSCULAR

## 2010-09-15 MED ORDER — HYDROCODONE-ACETAMINOPHEN 5-500 MG PO TABS: 1.0000 | ORAL_TABLET | Freq: Every day | ORAL | Status: AC

## 2010-09-15 NOTE — Progress Notes (Signed)
  Subjective:    Patient ID: Christina Williamson, female    DOB: 1955/04/25, 55 y.o.   MRN: [id number]  HPI The PT is here for annual exam and re-evaluation of chronic medical conditions, medication management and review of recent lab and radiology data.  Preventive health is updated, specifically  Cancer screening, Osteoporosis screening and Immunization.  Unfortunately she still has not hd a coloonscopy, despite repeated counseling, and her mammogram is past due. She states she will schedule and do both. The importance of this was stressed  Questions or concerns regarding consultations or procedures which the PT has had in the interim are  addressed. The PT denies any adverse reactions to current medications since the last visit.  2 week h/o disabling right groin pain,uncomfortable to sleep at night, standing for 12 hours most days and has gained weight. Smokes 1PPD , unwilling to quit now , but trying to reduce usage    Review of Systems    Denies recent fever or chills. Denies sinus pressure, nasal congestion, ear pain or sore throat. Denies chest congestion, productive cough or wheezing. Denies chest pains, palpitations, paroxysmal nocturnal dyspnea, orthopnea and leg swelling Denies abdominal pain, nausea, vomiting,diarrhea or constipation.  Denies rectal bleeding or change in bowel movement. Denies dysuria, frequency, hesitancy or incontinence.  Denies headaches, seizure, numbness, or tingling. Denies depression, anxiety or insomnia. Denies skin break down or rash.     Objective:   Physical Exam Pleasant well nourished female, alert and oriented x 3, in no cardio-pulmonary distress. Afebrile. HEENT No facial trauma or asymetry. No sinus tenderness  EOMI, PERTL, fundoscopic exam is normal, no hemorhage or exudate. External ears normal, tympanic membranes clear. Oropharynx moist, no exudate, poor dentition. Neck: supple, no adenopathy,JVD or thyromegaly.No  bruits.  Chest: Clear to ascultation bilaterally.No crackles or wheezes. Non tender to palpation. Reduced air entry  throughout  Breast: No asymetry,no masses. No nipple discharge or inversion. No axillary or supraclavicular adenopathy  Cardiovascular system; Heart sounds normal,  S1 and  S2 ,no S3.  No murmur, or thrill. Apical beat not displaced Peripheral pulses normal.  Abdomen: Soft, non tender, no organomegaly or masses. No bruits. Bowel sounds normal. No guarding, tenderness or rebound.  Rectal:  No mass. guaic negative stool.  GU: External genitalia normal. No lesions. Vaginal canal normal.White vag  discharge. Uterusabsent, no adnexal masses, no  adnexal tenderness.  Musculoskeletal exam: Decreased ROM of lumbar  Spine,adequate in  hips , shoulders and knees. No deformity ,swelling or crepitus noted. No muscle wasting or atrophy.   Neurologic: Cranial nerves 2 to 12 intact. Power, tone ,sensation and reflexes normal throughout. No disturbance in gait. No tremor.  Skin: Intact, no ulceration, erythema , scaling or rash noted. Pigmentation normal throughout  Psych; Normal mood and affect. Judgement and concentration normal        Assessment & Plan:

## 2010-09-15 NOTE — Assessment & Plan Note (Signed)
Unchanged , cessation counseling done 

## 2010-09-15 NOTE — Patient Instructions (Addendum)
F/u in 4 months.  HBA1C and chem 7 today.   Please think about quitting smoking.  This is very important for your health.  Consider setting a quit date, then cutting back or switching brands to prepare to stop.  Also think of the money you will save every day by not smoking.  Quick Tips to Quit Smoking: Fix a date i.e. keep a date in mind from when you would not touch a tobacco product to smoke  Keep yourself busy and block your mind with work loads or reading books or watching movies in malls where smoking is not allowed  Vanish off the things which reminds you about smoking for example match box, or your favorite lighter, or the pipe you used for smoking, or your favorite jeans and shirt with which you used to enjoy smoking, or the club where you used to do smoking  Try to avoid certain people places and incidences where and with whom smoking is a common factor to add on  Praise yourself with some token gifts from the money you saved by stopping smoking  Anti Smoking teams are there to help you. Join their programs  Anti-smoking Gums are there in many medical shops. Try them to quit smoking   Side-effects of Smoking: Disease caused by smoking cigarettes are emphysema, bronchitis, heart failures  Premature death  Cancer is the major side effect of smoking  Heart attacks and strokes are the quick effects of smoking causing sudden death  Some smokers lives end up with limbs amputated  Breathing problem or fast breathing is another side effect of smoking  Due to more intakes of smokes, carbon mono-oxide goes into your brain and other muscles of the body which leads to swelling of the veins and blockage to the air passage to lungs  Carbon monoxide blocks blood vessels which leads to blockage in the flow of blood to different major body organs like heart lungs and thus leads to attacks and deaths  During pregnancy smoking is very harmful and leads to premature birth of the infant, spontaneous  abortions, low weight of the infant during birth  Fat depositions to narrow and blocked blood vessels causing heart attacks  In many cases cigarette smoking caused infertility in men    It is important that you exercise regularly at least 30 minutes 5 times a week. If you develop chest pain, have severe difficulty breathing, or feel very tired, stop exercising immediately and seek medical attention  A healthy diet is rich in fruit, vegetables and whole grains. Poultry fish, nuts and beans are a healthy choice for protein rather then red meat. A low sodium diet and drinking 64 ounces of water daily is generally recommended. Oils and sweet should be limited. Carbohydrates especially for those who are diabetic or overweight, should be limited to 34-45 gram per meal. It is important to eat on a regular schedule, at least 3 times daily. Snacks should be primarily fruits, vegetables or nuts.   You need to schedule your mammogram and colonoscopy, both are past due  Injections today for right groin pain and med is also sent in. Pls call if pain persists or worsens

## 2010-09-15 NOTE — Assessment & Plan Note (Signed)
Deteriorated, med prescribed and administered

## 2010-09-15 NOTE — Assessment & Plan Note (Signed)
Deteriorated. Patient re-educated about  the importance of commitment to a  minimum of 150 minutes of exercise per week. The importance of healthy food choices with portion control discussed. Encouraged to start a food diary, count calories and to consider  joining a support group. Sample diet sheets offered. Goals set by the patient for the next several months.    

## 2010-09-15 NOTE — Assessment & Plan Note (Signed)
Uncontrolled. Medication compliance addressed. Commitment to regular exercise, and healthy  eating habits with portion control discussed. DASH diet, and low fat diet discussed, and literature offered. No changes in medication at this time.  

## 2010-10-31 ENCOUNTER — Other Ambulatory Visit: Payer: Self-pay | Admitting: Family Medicine

## 2010-12-12 ENCOUNTER — Other Ambulatory Visit: Payer: Self-pay | Admitting: Family Medicine

## 2011-01-18 ENCOUNTER — Other Ambulatory Visit: Payer: Self-pay | Admitting: Family Medicine

## 2011-06-07 ENCOUNTER — Other Ambulatory Visit: Payer: Self-pay | Admitting: Family Medicine

## 2011-06-28 ENCOUNTER — Encounter: Payer: Self-pay | Admitting: Family Medicine

## 2011-06-28 ENCOUNTER — Ambulatory Visit (INDEPENDENT_AMBULATORY_CARE_PROVIDER_SITE_OTHER): Payer: PRIVATE HEALTH INSURANCE | Admitting: Family Medicine

## 2011-06-28 DIAGNOSIS — I10 Essential (primary) hypertension: Secondary | ICD-10-CM

## 2011-06-29 ENCOUNTER — Encounter: Payer: Self-pay | Admitting: Family Medicine

## 2011-06-29 ENCOUNTER — Ambulatory Visit (INDEPENDENT_AMBULATORY_CARE_PROVIDER_SITE_OTHER): Payer: PRIVATE HEALTH INSURANCE | Admitting: Family Medicine

## 2011-06-29 VITALS — BP 140/90 | HR 72 | Resp 16 | Ht 63.75 in | Wt 190.4 lb

## 2011-06-29 DIAGNOSIS — Z79899 Other long term (current) drug therapy: Secondary | ICD-10-CM

## 2011-06-29 DIAGNOSIS — F17200 Nicotine dependence, unspecified, uncomplicated: Secondary | ICD-10-CM

## 2011-06-29 DIAGNOSIS — R5383 Other fatigue: Secondary | ICD-10-CM

## 2011-06-29 DIAGNOSIS — I10 Essential (primary) hypertension: Secondary | ICD-10-CM

## 2011-06-29 DIAGNOSIS — R5381 Other malaise: Secondary | ICD-10-CM

## 2011-06-29 DIAGNOSIS — R7303 Prediabetes: Secondary | ICD-10-CM

## 2011-06-29 DIAGNOSIS — R7309 Other abnormal glucose: Secondary | ICD-10-CM

## 2011-06-29 DIAGNOSIS — E669 Obesity, unspecified: Secondary | ICD-10-CM

## 2011-06-29 MED ORDER — AMLODIPINE BESYLATE 10 MG PO TABS: ORAL_TABLET | ORAL | Status: DC

## 2011-06-29 MED ORDER — PHENTERMINE HCL 37.5 MG PO CAPS: 37.5000 mg | ORAL_CAPSULE | ORAL | Status: DC

## 2011-06-29 MED ORDER — TRIAMTERENE-HCTZ 75-50 MG PO TABS: ORAL_TABLET | ORAL | Status: DC

## 2011-06-29 MED ORDER — BENAZEPRIL HCL 20 MG PO TABS: ORAL_TABLET | ORAL | Status: DC

## 2011-06-29 NOTE — Progress Notes (Signed)
  Subjective:    Patient ID: Christina Williamson, female    DOB: [birth year], 56 y.o.   MRN: [id number]  HPI  The PT is here for follow up and re-evaluation of chronic medical conditions, medication management and review of any available recent lab and radiology data.  Preventive health is updated, specifically  Cancer screening and Immunization.   Questions or concerns regarding consultations or procedures which the PT has had in the interim are  addressed. The PT denies any adverse reactions to current medications since the last visit.  There are no new concerns. She has been out of her blood pressure medication for 2 weeks There are no specific complaints , except concerned about weight gain and fatigue     Review of Systems See HPI Denies recent fever or chills. Denies sinus pressure, nasal congestion, ear pain or sore throat. Denies chest congestion, productive cough or wheezing. Denies chest pains, palpitations and leg swelling Denies abdominal pain, nausea, vomiting,diarrhea or constipation.   Denies dysuria, frequency, hesitancy or incontinence. Denies joint pain, swelling and limitation in mobility. Denies headaches, seizures, numbness, or tingling. Denies depression, anxiety or insomnia. Denies skin break down or rash.        Objective:   Physical Exam  Patient alert and oriented and in no cardiopulmonary distress.  HEENT: No facial asymmetry, EOMI, no sinus tenderness,  oropharynx pink and moist.  Neck supple no adenopathy.  Chest: Clear to auscultation bilaterally.  CVS: S1, S2 no murmurs, no S3.  ABD: Soft non tender. Bowel sounds normal.  Ext: No edema  MS: Adequate ROM spine, shoulders, hips and knees.  Skin: Intact, no ulcerations or rash noted.  Psych: Good eye contact, normal affect. Memory intact not anxious or depressed appearing.  CNS: CN 2-12 intact, power, tone and sensation normal throughout.       Assessment & Plan:

## 2011-06-29 NOTE — Patient Instructions (Signed)
F/u in 4 month  Fasting lipid, chem 7, HBA1C, TSH as soon as possible, past due.  Mammogram to be scheduled on your way out.  You need to STOP smoking   It is important that you exercise regularly at least 30 minutes 5 times a week. If you develop chest pain, have severe difficulty breathing, or feel very tired, stop exercising immediately and seek medical attention

## 2011-06-30 DIAGNOSIS — R7303 Prediabetes: Secondary | ICD-10-CM | POA: Insufficient documentation

## 2011-06-30 NOTE — Assessment & Plan Note (Signed)
Unchanged, current 3 per day

## 2011-06-30 NOTE — Assessment & Plan Note (Signed)
Uncontrolled, non compliant with medication

## 2011-06-30 NOTE — Assessment & Plan Note (Signed)
Counseled to reduce carb and sweet intake with a regular exercise program and weight loss

## 2011-06-30 NOTE — Assessment & Plan Note (Signed)
Deteriorated. Patient re-educated about  the importance of commitment to a  minimum of 150 minutes of exercise per week. The importance of healthy food choices with portion control discussed. Encouraged to start a food diary, count calories and to consider  joining a support group. Sample diet sheets offered. Goals set by the patient for the next several months.    

## 2011-07-04 NOTE — Progress Notes (Signed)
  Subjective:    Patient ID: Christina Williamson, female    DOB: 1955/06/20, 56 y.o.   MRN: [id number]  HPI Pt did not keep appt , so no notes , no charge   Review of Systems     Objective:   Physical Exam        Assessment & Plan:

## 2011-07-09 LAB — HEMOGLOBIN A1C
Hgb A1c MFr Bld: 5.8 % — ABNORMAL HIGH (ref <5.7)
Mean Plasma Glucose: 120 mg/dL — ABNORMAL HIGH (ref <117)

## 2011-07-09 LAB — LIPID PANEL
LDL Cholesterol: 109 mg/dL — ABNORMAL HIGH (ref 0–99)
Triglycerides: 127 mg/dL (ref <150)
VLDL: 25 mg/dL (ref 0–40)

## 2011-07-09 LAB — BASIC METABOLIC PANEL
CO2: 24 mEq/L (ref 19–32)
Chloride: 105 mEq/L (ref 96–112)
Potassium: 4.1 mEq/L (ref 3.5–5.3)
Sodium: 139 mEq/L (ref 135–145)

## 2011-07-14 NOTE — Progress Notes (Signed)
Addended by: Denman George B on: 07/14/2011 04:39 PM   Modules accepted: Orders

## 2011-07-20 ENCOUNTER — Encounter: Payer: Self-pay | Admitting: Family Medicine

## 2011-10-25 ENCOUNTER — Ambulatory Visit: Payer: PRIVATE HEALTH INSURANCE | Admitting: Family Medicine

## 2011-11-08 ENCOUNTER — Encounter: Payer: Self-pay | Admitting: Family Medicine

## 2011-11-08 ENCOUNTER — Ambulatory Visit (INDEPENDENT_AMBULATORY_CARE_PROVIDER_SITE_OTHER): Payer: PRIVATE HEALTH INSURANCE | Admitting: Family Medicine

## 2011-11-08 VITALS — BP 122/86 | HR 67 | Resp 16 | Ht 63.75 in | Wt 181.4 lb

## 2011-11-08 DIAGNOSIS — F3289 Other specified depressive episodes: Secondary | ICD-10-CM

## 2011-11-08 DIAGNOSIS — F17200 Nicotine dependence, unspecified, uncomplicated: Secondary | ICD-10-CM

## 2011-11-08 DIAGNOSIS — R7303 Prediabetes: Secondary | ICD-10-CM

## 2011-11-08 DIAGNOSIS — F329 Major depressive disorder, single episode, unspecified: Secondary | ICD-10-CM | POA: Insufficient documentation

## 2011-11-08 DIAGNOSIS — F32A Depression, unspecified: Secondary | ICD-10-CM

## 2011-11-08 DIAGNOSIS — I10 Essential (primary) hypertension: Secondary | ICD-10-CM

## 2011-11-08 DIAGNOSIS — E669 Obesity, unspecified: Secondary | ICD-10-CM

## 2011-11-08 DIAGNOSIS — R7309 Other abnormal glucose: Secondary | ICD-10-CM

## 2011-11-08 MED ORDER — AMLODIPINE BESYLATE 10 MG PO TABS: ORAL_TABLET | ORAL | Status: DC

## 2011-11-08 MED ORDER — PHENTERMINE HCL 37.5 MG PO TBDP: 1.0000 | ORAL_TABLET | Freq: Every day | ORAL | Status: DC

## 2011-11-08 MED ORDER — VENLAFAXINE HCL 37.5 MG PO TABS: 37.5000 mg | ORAL_TABLET | Freq: Every day | ORAL | Status: DC

## 2011-11-08 MED ORDER — TRIAMTERENE-HCTZ 75-50 MG PO TABS: ORAL_TABLET | ORAL | Status: DC

## 2011-11-08 MED ORDER — BENAZEPRIL HCL 20 MG PO TABS: ORAL_TABLET | ORAL | Status: DC

## 2011-11-08 NOTE — Patient Instructions (Addendum)
F/u early  December.  You are referred for therapy , and need to start medication for depression, i hope you feel better soon  Fasting chem 7 and HBa1C in December before visit  You need to stop smoking   Please think about quitting smoking.  This is very important for your health.  Consider setting a quit date, then cutting back or switching brands to prepare to stop.  Also think of the money you will save every day by not smoking.  Quick Tips to Quit Smoking: Fix a date i.e. keep a date in mind from when you would not touch a tobacco product to smoke  Keep yourself busy and block your mind with work loads or reading books or watching movies in malls where smoking is not allowed  Vanish off the things which reminds you about smoking for example match box, or your favorite lighter, or the pipe you used for smoking, or your favorite jeans and shirt with which you used to enjoy smoking, or the club where you used to do smoking  Try to avoid certain people places and incidences where and with whom smoking is a common factor to add on  Praise yourself with some token gifts from the money you saved by stopping smoking  Anti Smoking teams are there to help you. Join their programs  Anti-smoking Gums are there in many medical shops. Try them to quit smoking   Side-effects of Smoking: Disease caused by smoking cigarettes are emphysema, bronchitis, heart failures  Premature death  Cancer is the major side effect of smoking  Heart attacks and strokes are the quick effects of smoking causing sudden death  Some smokers lives end up with limbs amputated  Breathing problem or fast breathing is another side effect of smoking  Due to more intakes of smokes, carbon mono-oxide goes into your brain and other muscles of the body which leads to swelling of the veins and blockage to the air passage to lungs  Carbon monoxide blocks blood vessels which leads to blockage in the flow of blood to different major  body organs like heart lungs and thus leads to attacks and deaths  During pregnancy smoking is very harmful and leads to premature birth of the infant, spontaneous abortions, low weight of the infant during birth  Fat depositions to narrow and blocked blood vessels causing heart attacks  In many cases cigarette smoking caused infertility in men

## 2011-11-15 NOTE — Assessment & Plan Note (Signed)
Uncontrolled, due to spouse being unfaithful, pt to go for therapy and also start medication

## 2011-11-15 NOTE — Assessment & Plan Note (Signed)
Controlled, no change in medication DASH diet and commitment to daily physical activity for a minimum of 30 minutes discussed and encouraged, as a part of hypertension management. The importance of attaining a healthy weight is also discussed.  

## 2011-11-15 NOTE — Assessment & Plan Note (Signed)
Unchanged. Patient counseled for approximately 5 minutes regarding the health risks of ongoing nicotine use, specifically all types of cancer, heart disease, stroke and respiratory failure. The options available for help with cessation ,the behavioral changes to assist the process, and the option to either gradully reduce usage  Or abruptly stop.is also discussed. Pt is also encouraged to set specific goals in number of cigarettes used daily, as well as to set a quit date.  

## 2011-11-15 NOTE — Assessment & Plan Note (Signed)
Improved. Pt applauded on succesful weight loss through lifestyle change, and encouraged to continue same. Weight loss goal set for the next several months.  

## 2011-11-15 NOTE — Progress Notes (Signed)
  Subjective:    Patient ID: Christina Williamson, female    DOB: May 18, 1955, 56 y.o.   MRN: [id number]  HPI The PT is here for follow up and re-evaluation of chronic medical conditions, medication management and review of any available recent lab and radiology data.  Preventive health is updated, specifically  Cancer screening and Immunization.   Questions or concerns regarding consultations or procedures which the PT has had in the interim are  addressed. The PT denies any adverse reactions to current medications since the last visit.  C/o increased depression symptoms, crying spells, poor self esteem and anger at her spouse and the woman he is being unfaithful with. Not actively suicidal or homicidal, no hallucinations    Review of Systems See HPI Denies recent fever or chills. Denies sinus pressure, nasal congestion, ear pain or sore throat. Denies chest congestion, productive cough or wheezing. Denies chest pains, palpitations and leg swelling Denies abdominal pain, nausea, vomiting,diarrhea or constipation.   Denies dysuria, frequency, hesitancy or incontinence. Denies joint pain, swelling and limitation in mobility. Denies headaches, seizures, numbness, or tingling. Denies skin break down or rash.        Objective:   Physical Exam  Patient alert and oriented and in no cardiopulmonary distress.  HEENT: No facial asymmetry, EOMI, no sinus tenderness,  oropharynx pink and moist.  Neck supple no adenopathy.  Chest: Clear to auscultation bilaterally.Decreased air entry throughout  CVS: S1, S2 no murmurs, no S3.  ABD: Soft non tender. Bowel sounds normal.  Ext: No edema  MS: Adequate ROM spine, shoulders, hips and knees.  Skin: Intact, no ulcerations or rash noted.  Psych: Good eye contact, normal affect. Memory intact tearful and  depressed appearing.  CNS: CN 2-12 intact, power, tone and sensation normal throughout.       Assessment & Plan:

## 2011-11-23 ENCOUNTER — Ambulatory Visit (HOSPITAL_COMMUNITY): Payer: PRIVATE HEALTH INSURANCE | Admitting: Psychiatry

## 2012-03-22 ENCOUNTER — Telehealth: Payer: Self-pay | Admitting: Family Medicine

## 2012-03-22 ENCOUNTER — Ambulatory Visit (INDEPENDENT_AMBULATORY_CARE_PROVIDER_SITE_OTHER): Payer: PRIVATE HEALTH INSURANCE

## 2012-03-22 VITALS — BP 140/92 | Wt 191.0 lb

## 2012-03-22 DIAGNOSIS — R7303 Prediabetes: Secondary | ICD-10-CM

## 2012-03-22 DIAGNOSIS — M79671 Pain in right foot: Secondary | ICD-10-CM

## 2012-03-22 DIAGNOSIS — M79609 Pain in unspecified limb: Secondary | ICD-10-CM

## 2012-03-22 DIAGNOSIS — R5381 Other malaise: Secondary | ICD-10-CM

## 2012-03-22 DIAGNOSIS — I10 Essential (primary) hypertension: Secondary | ICD-10-CM

## 2012-03-22 MED ORDER — METHYLPREDNISOLONE ACETATE 80 MG/ML IJ SUSP
80.0000 mg | Freq: Once | INTRAMUSCULAR | Status: AC
  Administered 2012-03-22: 80 mg via INTRAMUSCULAR

## 2012-03-22 MED ORDER — KETOROLAC TROMETHAMINE 60 MG/2ML IM SOLN
60.0000 mg | Freq: Once | INTRAMUSCULAR | Status: AC
  Administered 2012-03-22: 60 mg via INTRAMUSCULAR

## 2012-03-22 NOTE — Telephone Encounter (Signed)
Pt aware and labs ordered 

## 2012-03-22 NOTE — Telephone Encounter (Signed)
Has a right heel spur and can't stand on her foot. 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 Wants to come in for injections. Ok to come in today?

## 2012-03-22 NOTE — Telephone Encounter (Signed)
Ok toradol 60 mg and depo medrol 80mg  Im today for heel pain, nurse visit only. Needs lab today, HBA1C, chem 7 and CBC, Ensure you check and document BP I suspect high, she needs to schedule OV with me in the next 4 weeks if high, if normal then 4 month MD f/u needed, with meds

## 2012-03-27 ENCOUNTER — Other Ambulatory Visit: Payer: Self-pay | Admitting: Family Medicine

## 2012-03-27 LAB — CBC WITH DIFFERENTIAL/PLATELET
Hemoglobin: 12.3 g/dL (ref 12.0–15.0)
Lymphocytes Relative: 31 % (ref 12–46)
Lymphs Abs: 2.2 10*3/uL (ref 0.7–4.0)
MCH: 31.9 pg (ref 26.0–34.0)
Monocytes Relative: 6 % (ref 3–12)
Neutro Abs: 4.2 10*3/uL (ref 1.7–7.7)
Neutrophils Relative %: 60 % (ref 43–77)
RBC: 3.85 MIL/uL — ABNORMAL LOW (ref 3.87–5.11)
WBC: 7.1 10*3/uL (ref 4.0–10.5)

## 2012-03-27 LAB — BASIC METABOLIC PANEL
Chloride: 106 mEq/L (ref 96–112)
Potassium: 4.6 mEq/L (ref 3.5–5.3)
Sodium: 142 mEq/L (ref 135–145)

## 2012-03-27 LAB — HEMOGLOBIN A1C
Hgb A1c MFr Bld: 6.1 % — ABNORMAL HIGH (ref <5.7)
Mean Plasma Glucose: 128 mg/dL — ABNORMAL HIGH (ref <117)

## 2012-03-29 ENCOUNTER — Encounter: Payer: Self-pay | Admitting: Family Medicine

## 2012-03-29 ENCOUNTER — Ambulatory Visit (INDEPENDENT_AMBULATORY_CARE_PROVIDER_SITE_OTHER): Payer: PRIVATE HEALTH INSURANCE | Admitting: Family Medicine

## 2012-03-29 VITALS — BP 140/100 | HR 86 | Resp 16 | Ht 63.75 in | Wt 193.0 lb

## 2012-03-29 DIAGNOSIS — E669 Obesity, unspecified: Secondary | ICD-10-CM

## 2012-03-29 DIAGNOSIS — R5381 Other malaise: Secondary | ICD-10-CM

## 2012-03-29 DIAGNOSIS — R7303 Prediabetes: Secondary | ICD-10-CM

## 2012-03-29 DIAGNOSIS — F17200 Nicotine dependence, unspecified, uncomplicated: Secondary | ICD-10-CM

## 2012-03-29 DIAGNOSIS — R7309 Other abnormal glucose: Secondary | ICD-10-CM

## 2012-03-29 DIAGNOSIS — I10 Essential (primary) hypertension: Secondary | ICD-10-CM

## 2012-03-29 DIAGNOSIS — M722 Plantar fascial fibromatosis: Secondary | ICD-10-CM

## 2012-03-29 MED ORDER — BENAZEPRIL HCL 40 MG PO TABS: 40.0000 mg | ORAL_TABLET | Freq: Every day | ORAL | Status: DC

## 2012-03-29 NOTE — Assessment & Plan Note (Signed)
Deteriorated. Patient re-educated about  the importance of commitment to a  minimum of 150 minutes of exercise per week. The importance of healthy food choices with portion control discussed. Encouraged to start a food diary, count calories and to consider  joining a support group. Sample diet sheets offered. Goals set by the patient for the next several months.    

## 2012-03-29 NOTE — Assessment & Plan Note (Signed)
Improved, had to be treated by podiatry

## 2012-03-29 NOTE — Assessment & Plan Note (Signed)
Deteriorated Patient educated about the importance of limiting  Carbohydrate intake , the need to commit to daily physical activity for a minimum of 30 minutes , and to commit weight loss. The fact that changes in all these areas will reduce or eliminate all together the development of diabetes is stressed.    

## 2012-03-29 NOTE — Patient Instructions (Addendum)
F/u in 4 month  Blood pressure is still too high, increase benazepril 20mg  to TWO daily, new medication is benazepril 40mg  ONE  daily  Continue the other meds, amlodipine , and triamterene as before.  Please cut out  Sodas, chips, and salty foods.   Increase the fresh fruit and vegetables and commit to daily exercise for 30 minutes every day...you will become more ALIVE   You need to quit smoking  Hba1C, and chem 7 in 4 month  Please check on colonoscopy  Use zyrtec daily during high allergy season

## 2012-03-29 NOTE — Assessment & Plan Note (Signed)
Unchanged, smokes 3 to 5 per day, unwilling to quit at this time Patient counseled for approximately 5 minutes regarding the health risks of ongoing nicotine use, specifically all types of cancer, heart disease, stroke and respiratory failure. The options available for help with cessation ,the behavioral changes to assist the process, and the option to either gradully reduce usage  Or abruptly stop.is also discussed. Pt is also encouraged to set specific goals in number of cigarettes used daily, as well as to set a quit date.

## 2012-03-29 NOTE — Assessment & Plan Note (Signed)
Uncontrolled, dose increase on benazepril. DASH diet and commitment to daily physical activity for a minimum of 30 minutes discussed and encouraged, as a part of hypertension management. The importance of attaining a healthy weight is also discussed.

## 2012-03-29 NOTE — Progress Notes (Signed)
  Subjective:    Patient ID: Christina Williamson, female    DOB: Aug 29, 1955, 57 y.o.   MRN: [id number]  HPI The PT is here for follow up and re-evaluation of chronic medical conditions, medication management and review of any available recent lab and radiology data.  Preventive health is updated, specifically  Cancer screening and Immunization.   Questions or concerns regarding consultations or procedures which the PT has had in the interim are  Addressed.Had to see podiatry for bilateral foot pain recently, injection Im in the office did not help The PT denies any adverse reactions to current medications since the last visit.  states she is no longer depressed since her spouse is "back home" He remains an alcoholic, and she is challenged by this    Review of Systems See HPI Denies recent fever or chills. Chronic  sinus pressure, nasal congestion,and clear drainage, no ear pain or sore throat. Denies chest congestion, productive cough or wheezing. Denies chest pains, palpitations and leg swelling Denies abdominal pain, nausea, vomiting,diarrhea or constipation.   Denies dysuria, frequency, hesitancy or incontinence.  Denies headaches, seizures, numbness, or tingling. Denies depression, anxiety or insomnia. Denies skin break down or rash.        Objective:   Physical Exam  Patient alert and oriented and in no cardiopulmonary distress.  HEENT: No facial asymmetry, EOMI, no sinus tenderness,  oropharynx pink and moist.  Neck supple no adenopathy.Eryhtema and edema of nasal mucosa  Chest: Clear to auscultation bilaterally.decreased though adequate air entry  CVS: S1, S2 no murmurs, no S3.  ABD: Soft non tender. Bowel sounds normal.  Ext: No edema  MS: Adequate ROM spine, shoulders, hips and knees.  Skin: Intact, no ulcerations or rash noted.  Psych: Good eye contact, normal affect. Memory intact not anxious or depressed appearing.  CNS: CN 2-12 intact, power, tone and  sensation normal throughout.       Assessment & Plan:

## 2012-04-05 ENCOUNTER — Ambulatory Visit: Payer: PRIVATE HEALTH INSURANCE | Admitting: Family Medicine

## 2012-07-25 ENCOUNTER — Other Ambulatory Visit: Payer: Self-pay | Admitting: Family Medicine

## 2012-07-25 ENCOUNTER — Ambulatory Visit (INDEPENDENT_AMBULATORY_CARE_PROVIDER_SITE_OTHER): Payer: PRIVATE HEALTH INSURANCE | Admitting: Family Medicine

## 2012-07-25 DIAGNOSIS — I10 Essential (primary) hypertension: Secondary | ICD-10-CM

## 2012-07-25 NOTE — Progress Notes (Signed)
  Subjective:    Patient ID: Christina Williamson, female    DOB: [birth year], 56 y.o.   MRN: [id number]  HPI Pt did not keep appt   Review of Systems     Objective:   Physical Exam        Assessment & Plan:

## 2012-10-17 ENCOUNTER — Ambulatory Visit (INDEPENDENT_AMBULATORY_CARE_PROVIDER_SITE_OTHER): Payer: PRIVATE HEALTH INSURANCE | Admitting: Family Medicine

## 2012-10-17 ENCOUNTER — Encounter: Payer: Self-pay | Admitting: Family Medicine

## 2012-10-17 ENCOUNTER — Other Ambulatory Visit: Payer: Self-pay | Admitting: Family Medicine

## 2012-10-17 VITALS — BP 124/88 | HR 74 | Resp 16 | Ht 63.75 in | Wt 188.8 lb

## 2012-10-17 DIAGNOSIS — N183 Chronic kidney disease, stage 3 unspecified: Secondary | ICD-10-CM

## 2012-10-17 DIAGNOSIS — R7303 Prediabetes: Secondary | ICD-10-CM

## 2012-10-17 DIAGNOSIS — F17200 Nicotine dependence, unspecified, uncomplicated: Secondary | ICD-10-CM

## 2012-10-17 DIAGNOSIS — R7309 Other abnormal glucose: Secondary | ICD-10-CM

## 2012-10-17 DIAGNOSIS — E669 Obesity, unspecified: Secondary | ICD-10-CM

## 2012-10-17 DIAGNOSIS — Z1211 Encounter for screening for malignant neoplasm of colon: Secondary | ICD-10-CM

## 2012-10-17 DIAGNOSIS — I10 Essential (primary) hypertension: Secondary | ICD-10-CM

## 2012-10-17 LAB — TSH: TSH: 0.956 u[IU]/mL (ref 0.350–4.500)

## 2012-10-17 LAB — LIPID PANEL
HDL: 71 mg/dL (ref >39)
LDL Cholesterol: 107 mg/dL — ABNORMAL HIGH (ref 0–99)
Total CHOL/HDL Ratio: 2.8 Ratio
Triglycerides: 95 mg/dL (ref <150)
VLDL: 19 mg/dL (ref 0–40)

## 2012-10-17 LAB — BASIC METABOLIC PANEL
Calcium: 9.8 mg/dL (ref 8.4–10.5)
Creat: 1.83 mg/dL — ABNORMAL HIGH (ref 0.50–1.10)

## 2012-10-17 LAB — HEMOGLOBIN A1C
Hgb A1c MFr Bld: 5.9 % — ABNORMAL HIGH (ref <5.7)
Mean Plasma Glucose: 123 mg/dL — ABNORMAL HIGH (ref <117)

## 2012-10-17 MED ORDER — BUPROPION HCL ER (SR) 150 MG PO TB12: 150.0000 mg | ORAL_TABLET | Freq: Two times a day (BID) | ORAL | Status: DC

## 2012-10-17 NOTE — Progress Notes (Signed)
  Subjective:    Patient ID: Christina Williamson, female    DOB: 07/07/1955, 57 y.o.   MRN: [id number]  HPI  The PT is here for follow up and re-evaluation of chronic medical conditions, medication management and review of any available recent lab and radiology data.  Preventive health is updated, specifically  Cancer screening and Immunization.   Questions or concerns regarding consultations or procedures which the PT has had in the interim are  addressed. The PT denies any adverse reactions to current medications since the last visit.  Still smoking understands the need to quit but still struggling with this. Weight loss is not what she would want because of lack of regular exercise also inconsistent eating . She intends to work on both and is committed to keeping appointments regullrly  As she struggles to improve her health   Review of Systems See HPI Denies recent fever or chills. Denies sinus pressure, nasal congestion, ear pain or sore throat. Denies chest congestion, productive cough or wheezing. Denies chest pains, palpitations and leg swelling Denies abdominal pain, nausea, vomiting,diarrhea or constipation.   Denies dysuria, frequency, hesitancy or incontinence.  Denies headaches, seizures, numbness, or tingling. Denies depression, anxiety or insomnia. Denies skin break down or rash.        Objective:   Physical Exam  Patient alert and oriented and in no cardiopulmonary distress.  HEENT: No facial asymmetry, EOMI, no sinus tenderness,  oropharynx pink and moist.  Neck supple no adenopathy.  Chest: Clear to auscultation bilaterally.Decreased though adequate air entry  CVS: S1, S2 no murmurs, no S3.  ABD: Soft non tender.  Ext: No edema  MS: Adequate ROM spine, shoulders, hips and knees.  Skin: Intact, no ulcerations or rash noted.  Psych: Good eye contact, normal affect. Memory intact not anxious or depressed appearing.  CNS: CN 2-12 intact, power, tone and  sensation normal throughout.       Assessment & Plan:

## 2012-10-17 NOTE — Patient Instructions (Addendum)
F/u in 4 month, call if you need me before  Plan to quit smoking in 2 weeks, start zyban  Today as  Prescribed.   Check on upcoming smoking cessation class through the health dept this is free   Commit to 30 minutes daily of physical activity  Weight loss goal of 1.5 to 2 pounds per month  Fasting lipid, chem 7, TSH and hBa1C today  You will be referred for colonoscopy  Pls schedule your mammogram , past due

## 2012-10-18 LAB — CREATININE WITH EST GFR: GFR, Est African American: 33 mL/min — ABNORMAL LOW

## 2012-10-19 ENCOUNTER — Other Ambulatory Visit: Payer: Self-pay | Admitting: Family Medicine

## 2012-10-19 DIAGNOSIS — I129 Hypertensive chronic kidney disease with stage 1 through stage 4 chronic kidney disease, or unspecified chronic kidney disease: Secondary | ICD-10-CM | POA: Insufficient documentation

## 2012-10-19 DIAGNOSIS — N189 Chronic kidney disease, unspecified: Secondary | ICD-10-CM | POA: Insufficient documentation

## 2012-10-19 DIAGNOSIS — N184 Chronic kidney disease, stage 4 (severe): Secondary | ICD-10-CM | POA: Insufficient documentation

## 2012-10-19 NOTE — Assessment & Plan Note (Signed)
Improved. Pt applauded on succesful weight loss through lifestyle change, and encouraged to continue same. Weight loss goal set for the next several months.  

## 2012-10-19 NOTE — Assessment & Plan Note (Signed)
Pt ready to quit and will also look into smoking cessation classes Patient counseled for approximately 5 minutes regarding the health risks of ongoing nicotine use, specifically all types of cancer, heart disease, stroke and respiratory failure. The options available for help with cessation ,the behavioral changes to assist the process, and the option to either gradully reduce usage  Or abruptly stop.is also discussed. Pt is also encouraged to set specific goals in number of cigarettes used daily, as well as to set a quit date. Start wellbutrin

## 2012-10-19 NOTE — Assessment & Plan Note (Signed)
Controlled, no change in medication DASH diet and commitment to daily physical activity for a minimum of 30 minutes discussed and encouraged, as a part of hypertension management. The importance of attaining a healthy weight is also discussed.  

## 2012-10-19 NOTE — Assessment & Plan Note (Signed)
Improved. Patient educated about the importance of limiting  Carbohydrate intake , the need to commit to daily physical activity for a minimum of 30 minutes , and to commit weight loss. The fact that changes in all these areas will reduce or eliminate all together the development of diabetes is stressed.

## 2012-10-22 NOTE — Assessment & Plan Note (Signed)
Pt needs to establish with nephrology due to low GFr. I discussed this with her once results were in, reiterated the need to  Lower Dm risk by ;lifestyle change, stop smoking and to get better BP control

## 2012-10-23 ENCOUNTER — Encounter (INDEPENDENT_AMBULATORY_CARE_PROVIDER_SITE_OTHER): Payer: Self-pay | Admitting: *Deleted

## 2012-10-24 ENCOUNTER — Telehealth: Payer: Self-pay

## 2012-10-24 ENCOUNTER — Other Ambulatory Visit: Payer: Self-pay | Admitting: Family Medicine

## 2012-10-24 MED ORDER — PHENTERMINE HCL 37.5 MG PO TABS: 37.5000 mg | ORAL_TABLET | Freq: Every day | ORAL | Status: DC

## 2012-10-24 NOTE — Telephone Encounter (Signed)
Lab printed pl send and let her know, I will sign

## 2012-10-24 NOTE — Telephone Encounter (Signed)
Will fax to [place]

## 2012-10-24 NOTE — Telephone Encounter (Signed)
Wants an rx for phentermine sent in  [place] [facility] [place]

## 2012-12-28 ENCOUNTER — Other Ambulatory Visit: Payer: Self-pay | Admitting: Family Medicine

## 2013-01-04 ENCOUNTER — Other Ambulatory Visit: Payer: Self-pay

## 2013-02-12 ENCOUNTER — Other Ambulatory Visit: Payer: Self-pay | Admitting: Family Medicine

## 2013-02-12 LAB — HEMOGLOBIN A1C: Mean Plasma Glucose: 120 mg/dL — ABNORMAL HIGH (ref <117)

## 2013-02-12 LAB — BASIC METABOLIC PANEL
BUN: 30 mg/dL — ABNORMAL HIGH (ref 6–23)
CO2: 25 mEq/L (ref 19–32)
Chloride: 108 mEq/L (ref 96–112)
Creat: 1.79 mg/dL — ABNORMAL HIGH (ref 0.50–1.10)
Glucose, Bld: 96 mg/dL (ref 70–99)
Potassium: 4.1 mEq/L (ref 3.5–5.3)

## 2013-02-13 ENCOUNTER — Telehealth: Payer: Self-pay

## 2013-02-13 ENCOUNTER — Ambulatory Visit (INDEPENDENT_AMBULATORY_CARE_PROVIDER_SITE_OTHER): Payer: PRIVATE HEALTH INSURANCE | Admitting: Family Medicine

## 2013-02-13 ENCOUNTER — Encounter: Payer: Self-pay | Admitting: Family Medicine

## 2013-02-13 ENCOUNTER — Encounter (INDEPENDENT_AMBULATORY_CARE_PROVIDER_SITE_OTHER): Payer: Self-pay | Admitting: *Deleted

## 2013-02-13 VITALS — BP 124/84 | HR 85 | Resp 16 | Wt 195.8 lb

## 2013-02-13 DIAGNOSIS — Z23 Encounter for immunization: Secondary | ICD-10-CM

## 2013-02-13 DIAGNOSIS — I10 Essential (primary) hypertension: Secondary | ICD-10-CM

## 2013-02-13 DIAGNOSIS — E669 Obesity, unspecified: Secondary | ICD-10-CM

## 2013-02-13 DIAGNOSIS — Z1239 Encounter for other screening for malignant neoplasm of breast: Secondary | ICD-10-CM

## 2013-02-13 DIAGNOSIS — F17200 Nicotine dependence, unspecified, uncomplicated: Secondary | ICD-10-CM

## 2013-02-13 DIAGNOSIS — N183 Chronic kidney disease, stage 3 unspecified: Secondary | ICD-10-CM

## 2013-02-13 DIAGNOSIS — R7309 Other abnormal glucose: Secondary | ICD-10-CM

## 2013-02-13 DIAGNOSIS — Z1211 Encounter for screening for malignant neoplasm of colon: Secondary | ICD-10-CM

## 2013-02-13 DIAGNOSIS — R7303 Prediabetes: Secondary | ICD-10-CM

## 2013-02-13 MED ORDER — PHENTERMINE HCL 37.5 MG PO TBDP: 1.0000 | ORAL_TABLET | Freq: Every day | ORAL | Status: DC

## 2013-02-13 NOTE — Telephone Encounter (Signed)
Opened in error

## 2013-02-13 NOTE — Assessment & Plan Note (Signed)
Marked improvement, states wellbutrin is helping, quit date set for Jan 1

## 2013-02-13 NOTE — Assessment & Plan Note (Signed)
Has established with local nephrologist and has a 6 month f/u , was started on vit D

## 2013-02-13 NOTE — Assessment & Plan Note (Signed)
Controlled, no change in medication DASH diet and commitment to daily physical activity for a minimum of 30 minutes discussed and encouraged, as a part of hypertension management. The importance of attaining a healthy weight is also discussed.  

## 2013-02-13 NOTE — Assessment & Plan Note (Signed)
Slight improvement Patient educated about the importance of limiting  Carbohydrate intake , the need to commit to daily physical activity for a minimum of 30 minutes , and to commit weight loss. The fact that changes in all these areas will reduce or eliminate all together the development of diabetes is stressed.

## 2013-02-13 NOTE — Assessment & Plan Note (Signed)
Deteriorated. Patient re-educated about  the importance of commitment to a  minimum of 150 minutes of exercise per week. The importance of healthy food choices with portion control discussed. Encouraged to start a food diary, count calories and to consider  joining a support group. Sample diet sheets offered. Goals set by the patient for the next several months.    

## 2013-02-13 NOTE — Patient Instructions (Addendum)
F/u in 4 month, call if you need me before  Flu vaccine today  Congrats on reducing to 3 cigarettes per day, quit date is March 01, 2013, continue welbutrin for an additional 3 month  Weight is the highest its been in the  Past years, please COMMIT to eating changes as discussed, and exercise for 20 minutes twice daily  Commit to hALF phentermine every morning   Sugar has improved, which is great, also so is your blood pressure  HbA1C in 4 month, before next visit  You are referred for colonoscopy which is past due  Stop at checkout for mammogram appt please  Weight loss goal of 2.5 pounds per month

## 2013-02-13 NOTE — Progress Notes (Signed)
   Subjective:    Patient ID: Christina Williamson, female    DOB: 1956/02/24, 57 y.o.   MRN: [id number]  HPI  The PT is here for follow up and re-evaluation of chronic medical conditions, medication management and review of any available recent lab and radiology data.  Preventive health is updated, specifically  Cancer screening and Immunization.   Questions or concerns regarding consultations or procedures which the PT has had in the interim are  Addressed.Happy with nephrologist, still needs mammogram and colonoscopy The PT denies any adverse reactions to current medications since the last visit.  C/o chronic fatigue, no regular exercise and significant weight gain. Inconsistent use of phentermine also   Review of Systems See HPI Denies recent fever or chills. Chronic nasal and sinus congestion, not using allergy medication daily as she should and no regular nasal flushes Denies chest congestion, productive cough or wheezing. Denies chest pains, palpitations and leg swelling Denies abdominal pain, nausea, vomiting,diarrhea or constipation.   Denies dysuria, frequency, hesitancy or incontinence. Denies joint pain, swelling and limitation in mobility. Denies headaches, seizures, numbness, or tingling. Denies depression, anxiety or insomnia. Denies skin break down or rash.        Objective:   Physical Exam  Patient alert and oriented and in no cardiopulmonary distress.  HEENT: No facial asymmetry, EOMI, no sinus tenderness,  oropharynx pink and moist.  Neck supple no adenopathy.Erythema and edema of nasal mucosa  Chest: Clear to auscultation bilaterally.  CVS: S1, S2 no murmurs, no S3.  ABD: Soft non tender. Bowel sounds normal.  Ext: No edema  MS: Adequate ROM spine, shoulders, hips and knees.  Skin: Intact, no ulcerations or rash noted.  Psych: Good eye contact, normal affect. Memory intact not anxious or depressed appearing.  CNS: CN 2-12 intact, power, tone and  sensation normal throughout.       Assessment & Plan:

## 2013-03-05 ENCOUNTER — Ambulatory Visit (HOSPITAL_COMMUNITY): Payer: PRIVATE HEALTH INSURANCE

## 2013-03-12 ENCOUNTER — Ambulatory Visit (HOSPITAL_COMMUNITY)
Admission: RE | Admit: 2013-03-12 | Discharge: 2013-03-12 | Disposition: A | Discharge status: Home / Self Care | Payer: 59 | Source: Ambulatory Visit | Attending: Family Medicine | Admitting: Family Medicine

## 2013-03-12 DIAGNOSIS — Z1239 Encounter for other screening for malignant neoplasm of breast: Secondary | ICD-10-CM

## 2013-03-12 DIAGNOSIS — Z1231 Encounter for screening mammogram for malignant neoplasm of breast: Secondary | ICD-10-CM | POA: Insufficient documentation

## 2013-03-15 ENCOUNTER — Other Ambulatory Visit: Payer: Self-pay | Admitting: Family Medicine

## 2013-05-12 ENCOUNTER — Other Ambulatory Visit: Payer: Self-pay | Admitting: Family Medicine

## 2013-05-23 ENCOUNTER — Telehealth: Payer: Self-pay | Admitting: Family Medicine

## 2013-05-23 MED ORDER — AMLODIPINE BESYLATE 10 MG PO TABS: ORAL_TABLET | ORAL | Status: DC

## 2013-05-23 MED ORDER — TRIAMTERENE-HCTZ 75-50 MG PO TABS: ORAL_TABLET | ORAL | Status: DC

## 2013-05-23 NOTE — Telephone Encounter (Signed)
Sent enough to last until her appt

## 2013-07-02 ENCOUNTER — Encounter: Payer: Self-pay | Admitting: Family Medicine

## 2013-07-02 ENCOUNTER — Ambulatory Visit (INDEPENDENT_AMBULATORY_CARE_PROVIDER_SITE_OTHER): Payer: PRIVATE HEALTH INSURANCE | Admitting: Family Medicine

## 2013-07-02 VITALS — BP 106/82 | HR 80 | Resp 18 | Ht 63.75 in | Wt 181.0 lb

## 2013-07-02 DIAGNOSIS — N183 Chronic kidney disease, stage 3 unspecified: Secondary | ICD-10-CM

## 2013-07-02 DIAGNOSIS — E669 Obesity, unspecified: Secondary | ICD-10-CM

## 2013-07-02 DIAGNOSIS — R7309 Other abnormal glucose: Secondary | ICD-10-CM

## 2013-07-02 DIAGNOSIS — R7303 Prediabetes: Secondary | ICD-10-CM

## 2013-07-02 DIAGNOSIS — I10 Essential (primary) hypertension: Secondary | ICD-10-CM

## 2013-07-02 DIAGNOSIS — F17200 Nicotine dependence, unspecified, uncomplicated: Secondary | ICD-10-CM

## 2013-07-02 MED ORDER — BUPROPION HCL ER (SR) 150 MG PO TB12: 150.0000 mg | ORAL_TABLET | Freq: Every day | ORAL | Status: DC

## 2013-07-02 NOTE — Patient Instructions (Addendum)
CPE and  Pap mid September, call if you need me before   Fasting lipid, chem7 hBA1C, cBc, tSH in September  cXR today please  PLEASE get the colonoscopy done  Quit date for smoking is 07/16/2013, new is welbutrin one daily to help withthis  Commit to exercise for at least 30 minutes every day  Congrats  On weight loss , keep it up  Smoking Cessation, Tips for Success If you are ready to quit smoking, congratulations! You have chosen to help yourself be healthier. Cigarettes bring nicotine, tar, carbon monoxide, and other irritants into your body. Your lungs, heart, and blood vessels will be able to work better without these poisons. There are many different ways to quit smoking. Nicotine gum, nicotine patches, a nicotine inhaler, or nicotine nasal spray can help with physical craving. Hypnosis, support groups, and medicines help break the habit of smoking. WHAT THINGS CAN I DO TO MAKE QUITTING EASIER?  Here are some tips to help you quit for good:  Pick a date when you will quit smoking completely. Tell all of your friends and family about your plan to quit on that date.  Do not try to slowly cut down on the number of cigarettes you are smoking. Pick a quit date and quit smoking completely starting on that day.  Throw away all cigarettes.   Clean and remove all ashtrays from your home, work, and car.   On a card, write down your reasons for quitting. Carry the card with you and read it when you get the urge to smoke.   Cleanse your body of nicotine. Drink enough water and fluids to keep your urine clear or pale yellow. Do this after quitting to flush the nicotine from your body.   Learn to predict your moods. Do not let a bad situation be your excuse to have a cigarette. Some situations in your life might tempt you into wanting a cigarette.   Never have "just one" cigarette. It leads to wanting another and another. Remind yourself of your decision to quit.   Change habits  associated with smoking. If you smoked while driving or when feeling stressed, try other activities to replace smoking. Stand up when drinking your coffee. Brush your teeth after eating. Sit in a different chair when you read the paper. Avoid alcohol while trying to quit, and try to drink fewer caffeinated beverages. Alcohol and caffeine may urge you to smoke.   Avoid foods and drinks that can trigger a desire to smoke, such as sugary or spicy foods and alcohol.   Ask people who smoke not to smoke around you.   Have something planned to do right after eating or having a cup of coffee. For example, plan to take a walk or exercise.   Try a relaxation exercise to calm you down and decrease your stress. Remember, you may be tense and nervous for the first 2 weeks after you quit, but this will pass.   Find new activities to keep your hands busy. Play with a pen, coin, or rubber band. Doodle or draw things on paper.   Brush your teeth right after eating. This will help cut down on the craving for the taste of tobacco after meals. You can also try mouthwash.   Use oral substitutes in place of cigarettes. Try using lemon drops, carrots, cinnamon sticks, or chewing gum. Keep them handy so they are available when you have the urge to smoke.   When you have the urge  to smoke, try deep breathing.   Designate your home as a nonsmoking area.   If you are a heavy smoker, ask your health care provider about a prescription for nicotine chewing gum. It can ease your withdrawal from nicotine.   Reward yourself. Set aside the cigarette money you save and buy yourself something nice.   Look for support from others. Join a support group or smoking cessation program. Ask someone at home or at work to help you with your plan to quit smoking.   Always ask yourself, "Do I need this cigarette or is this just a reflex?" Tell yourself, "Today, I choose not to smoke," or "I do not want to smoke." You are  reminding yourself of your decision to quit.  Do not replace cigarette smoking with electronic cigarettes (commonly called e-cigarettes). The safety of e-cigarettes is unknown, and some may contain harmful chemicals.  If you relapse, do not give up! Plan ahead and think about what you will do the next time you get the urge to smoke.  HOW WILL I FEEL WHEN I QUIT SMOKING? You may have symptoms of withdrawal because your body is used to nicotine (the addictive substance in cigarettes). You may crave cigarettes, be irritable, feel very hungry, cough often, get headaches, or have difficulty concentrating. The withdrawal symptoms are only temporary. They are strongest when you first quit but will go away within 10 14 days. When withdrawal symptoms occur, stay in control. Think about your reasons for quitting. Remind yourself that these are signs that your body is healing and getting used to being without cigarettes. Remember that withdrawal symptoms are easier to treat than the major diseases that smoking can cause.  Even after the withdrawal is over, expect periodic urges to smoke. However, these cravings are generally short lived and will go away whether you smoke or not. Do not smoke!  WHAT RESOURCES ARE AVAILABLE TO HELP ME QUIT SMOKING? Your health care provider can direct you to community resources or hospitals for support, which may include:  Group support.  Education.  Hypnosis.  Therapy. Document Released: 11/14/2003 Document Revised: 12/06/2012 Document Reviewed: 08/03/2012 [facility] Patient Information 2014 [place], [place].

## 2013-08-05 NOTE — Assessment & Plan Note (Signed)
Controlled, no change in medication DASH diet and commitment to daily physical activity for a minimum of 30 minutes discussed and encouraged, as a part of hypertension management. The importance of attaining a healthy weight is also discussed.  

## 2013-08-05 NOTE — Assessment & Plan Note (Signed)
Improved. Pt applauded on succesful weight loss through lifestyle change, and encouraged to continue same. Weight loss goal set for the next several months.  

## 2013-08-05 NOTE — Assessment & Plan Note (Signed)
The importance of BP control and avoidance of NSAIDS is stressed to help to prevent further deterioration in kidney function

## 2013-08-05 NOTE — Assessment & Plan Note (Addendum)
Patient counseled for approximately 5 minutes regarding the health risks of ongoing nicotine use, specifically all types of cancer, heart disease, stroke and respiratory failure. The options available for help with cessation ,the behavioral changes to assist the process, and the option to either gradully reduce usage  Or abruptly stop.is also discussed. Pt is also encouraged to set specific goals in number of cigarettes used daily, as well as to set a quit date. She is to use zyban to help with this

## 2013-08-05 NOTE — Progress Notes (Signed)
   Subjective:    Patient ID: Christina Williamson, female    DOB: 12-24-1955, 58 y.o.   MRN: [id number]  HPI The PT is here for follow up and re-evaluation of chronic medical conditions, medication management and review of any available recent lab and radiology data.  Preventive health is updated, specifically  Cancer screening and Immunization.  Still needs colonoscopy  The PT denies any adverse reactions to current medications since the last visit.  There are no new concerns.  There are no specific complaints       Review of Systems See HPI Denies recent fever or chills. Denies sinus pressure, nasal congestion, ear pain or sore throat. Denies chest congestion, productive cough or wheezing. Denies chest pains, palpitations and leg swelling Denies abdominal pain, nausea, vomiting,diarrhea or constipation.   Denies dysuria, frequency, hesitancy or incontinence. Denies uncontrolled  joint pain, swelling and limitation in mobility. Denies headaches, seizures, numbness, or tingling. Denies depression, anxiety or insomnia. Denies skin break down or rash.        Objective:   Physical Exam BP 106/82  Pulse 80  Resp 18  Ht 5' 3.75" (1.619 m)  Wt 181 lb 0.6 oz (82.119 kg)  BMI 31.33 kg/m2  SpO2 98% Patient alert and oriented and in no cardiopulmonary distress.  HEENT: No facial asymmetry, EOMI,   oropharynx pink and moist.  Neck supple no JVD, no mass.  Chest: Clear to auscultation bilaterally.  CVS: S1, S2 no murmurs, no S3.  ABD: Soft non tender.   Ext: No edema  MS: Adequate ROM spine, shoulders, hips and knees.  Skin: Intact, no ulcerations or rash noted.  Psych: Good eye contact, normal affect. Memory intact not anxious or depressed appearing.  CNS: CN 2-12 intact, power,  normal throughout.no focal deficits noted.        Assessment & Plan:  HYPERTENSION Controlled, no change in medication DASH diet and commitment to daily physical activity for a minimum  of 30 minutes discussed and encouraged, as a part of hypertension management. The importance of attaining a healthy weight is also discussed.   Prediabetes Patient educated about the importance of limiting  Carbohydrate intake , the need to commit to daily physical activity for a minimum of 30 minutes , and to commit weight loss. The fact that changes in all these areas will reduce or eliminate all together the development of diabetes is stressed.   Updated lab needed at/ before next visit.   OBESITY Improved. Pt applauded on succesful weight loss through lifestyle change, and encouraged to continue same. Weight loss goal set for the next several months.   NICOTINE ADDICTION Patient counseled for approximately 5 minutes regarding the health risks of ongoing nicotine use, specifically all types of cancer, heart disease, stroke and respiratory failure. The options available for help with cessation ,the behavioral changes to assist the process, and the option to either gradully reduce usage  Or abruptly stop.is also discussed. Pt is also encouraged to set specific goals in number of cigarettes used daily, as well as to set a quit date. She is to use zyban to help with this   CKD (chronic kidney disease) stage 3, GFR 30-59 ml/min The importance of BP control and avoidance of NSAIDS is stressed to help to prevent further deterioration in kidney function   \]

## 2013-08-05 NOTE — Assessment & Plan Note (Signed)
Patient educated about the importance of limiting  Carbohydrate intake , the need to commit to daily physical activity for a minimum of 30 minutes , and to commit weight loss. The fact that changes in all these areas will reduce or eliminate all together the development of diabetes is stressed.   Updated lab needed at/ before next visit.  

## 2013-08-27 ENCOUNTER — Other Ambulatory Visit: Payer: Self-pay | Admitting: Family Medicine

## 2013-08-28 ENCOUNTER — Telehealth: Payer: Self-pay | Admitting: Family Medicine

## 2013-08-28 MED ORDER — PHENTERMINE HCL 37.5 MG PO TABS: 37.5000 mg | ORAL_TABLET | Freq: Every day | ORAL | Status: DC

## 2013-08-28 NOTE — Telephone Encounter (Signed)
meds refilled per electronic refill request

## 2013-08-28 NOTE — Telephone Encounter (Signed)
Refill sent.

## 2014-01-17 ENCOUNTER — Other Ambulatory Visit: Payer: Self-pay

## 2014-01-17 MED ORDER — AMLODIPINE BESYLATE 10 MG PO TABS: 10.0000 mg | ORAL_TABLET | Freq: Every day | ORAL | Status: DC

## 2014-01-17 MED ORDER — BENAZEPRIL HCL 40 MG PO TABS: 40.0000 mg | ORAL_TABLET | Freq: Every day | ORAL | Status: DC

## 2014-01-17 MED ORDER — TRIAMTERENE-HCTZ 75-50 MG PO TABS: 1.0000 | ORAL_TABLET | Freq: Every day | ORAL | Status: DC

## 2014-01-22 ENCOUNTER — Encounter: Payer: PRIVATE HEALTH INSURANCE | Admitting: Family Medicine

## 2014-02-11 ENCOUNTER — Ambulatory Visit (INDEPENDENT_AMBULATORY_CARE_PROVIDER_SITE_OTHER): Payer: BC Managed Care – PPO | Admitting: Family Medicine

## 2014-02-11 ENCOUNTER — Ambulatory Visit (INDEPENDENT_AMBULATORY_CARE_PROVIDER_SITE_OTHER): Payer: BC Managed Care – PPO

## 2014-02-11 ENCOUNTER — Encounter: Payer: Self-pay | Admitting: Family Medicine

## 2014-02-11 VITALS — BP 118/82 | HR 88 | Resp 16 | Ht 63.75 in | Wt 183.0 lb

## 2014-02-11 DIAGNOSIS — R4689 Other symptoms and signs involving appearance and behavior: Secondary | ICD-10-CM

## 2014-02-11 DIAGNOSIS — Z23 Encounter for immunization: Secondary | ICD-10-CM

## 2014-02-11 DIAGNOSIS — R7303 Prediabetes: Secondary | ICD-10-CM

## 2014-02-11 DIAGNOSIS — R7309 Other abnormal glucose: Secondary | ICD-10-CM

## 2014-02-11 DIAGNOSIS — Z1211 Encounter for screening for malignant neoplasm of colon: Secondary | ICD-10-CM

## 2014-02-11 DIAGNOSIS — I10 Essential (primary) hypertension: Secondary | ICD-10-CM

## 2014-02-11 DIAGNOSIS — Z720 Tobacco use: Secondary | ICD-10-CM

## 2014-02-11 DIAGNOSIS — F17200 Nicotine dependence, unspecified, uncomplicated: Secondary | ICD-10-CM

## 2014-02-11 DIAGNOSIS — IMO0001 Reserved for inherently not codable concepts without codable children: Secondary | ICD-10-CM

## 2014-02-11 DIAGNOSIS — Z13220 Encounter for screening for lipoid disorders: Secondary | ICD-10-CM

## 2014-02-11 NOTE — Assessment & Plan Note (Addendum)
Colonoscopy remains pas due , re educated pt re absolute need to get test, referred for a 3rd time states she will go denies change in [facility]

## 2014-02-11 NOTE — Progress Notes (Addendum)
Subjective:    Patient ID: Christina Williamson, female    DOB: [birth year], 58 y.o.   MRN: [id number]  HPI  The PT is here for follow up and re-evaluation of chronic medical conditions, medication management and review of any available recent lab and radiology data.  Preventive health is updated, specifically  Cancer screening and Immunization.still needs colonoscopy   Questions or concerns regarding consultations or procedures which the PT has had in the interim are  addressed. The PT denies any adverse reactions to current medications since the last visit.  There are no new concerns.  There are no specific complaints      Review of Systems See HPI Denies recent fever or chills. Denies sinus pressure, nasal congestion, ear pain or sore throat. Denies chest congestion, productive cough or wheezing. Denies chest pains, palpitations and leg swelling Denies abdominal pain, nausea, vomiting,diarrhea or constipation.   Denies dysuria, frequency, hesitancy or incontinence. Denies joint pain, swelling and limitation in mobility. Denies headaches, seizures, numbness, or tingling. Denies depression, anxiety or insomnia. Denies skin break down or rash.        Objective:   Physical Exam BP 118/82 mmHg  Pulse 88  Resp 16  Ht 5' 3.75" (1.619 m)  Wt 183 lb (83.008 kg)  BMI 31.67 kg/m2  SpO2 96% Patient alert and oriented and in no cardiopulmonary distress.  HEENT: No facial asymmetry, EOMI,   oropharynx pink and moist.  Neck supple no JVD, no mass.  Chest: Clear to auscultation bilaterally.decreased though adequate air entry  CVS: S1, S2 no murmurs, no S3.Regular rate.  ABD: Soft non tender.   Ext: No edema  MS: Adequate ROM spine, shoulders, hips and knees.  Skin: Intact, no ulcerations or rash noted.  Psych: Good eye contact, normal affect. Memory intact not anxious or depressed appearing.  CNS: CN 2-12 intact, power,  normal throughout.no focal deficits noted.        Assessment & Plan:  Essential hypertension Controlled, no change in medication DASH diet and commitment to daily physical activity for a minimum of 30 minutes discussed and encouraged, as a part of hypertension management. The importance of attaining a healthy weight is also discussed.   Prediabetes Patient educated about the importance of limiting  Carbohydrate intake , the need to commit to daily physical activity for a minimum of 30 minutes , and to commit weight loss. The fact that changes in all these areas will reduce or eliminate all together the development of diabetes is stressed.   Updated lab needed at/ before next visit.   Need for prophylactic vaccination and inoculation against influenza Vaccine administered at visit.   NICOTINE ADDICTION Detereiorated, up to 10 per day Patient counseled for approximately 5 minutes regarding the health risks of ongoing nicotine use, specifically all types of cancer, heart disease, stroke and respiratory failure. The options available for help with cessation ,the behavioral changes to assist the process, and the option to either gradully reduce usage  Or abruptly stop.is also discussed. Pt is also encouraged to set specific goals in number of cigarettes used daily, as well as to set a quit date.   Obesity, Class II, BMI 35-39.9, with comorbidity Deteriorated. Patient re-educated about  the importance of commitment to a  minimum of 150 minutes of exercise per week. The importance of healthy food choices with portion control discussed. Encouraged to start a food diary, count calories and to consider  joining a support group. Sample diet sheets offered. Goals  set by the patient for the next several months.     Non-compliant behavior Colonoscopy remains pas due , re educated pt re absolute need to get test, referred for a 3rd time states she will go denies change in [facility]

## 2014-02-11 NOTE — Patient Instructions (Addendum)
Annual exam in .5 month, call if you need me before  Flu vaccine today in left arm  REPLENS is useful for lubrication, and iincreased  activity   Fasting lipid, cmp, HBA1C, TSH, and CBc in the next 1 to 2 week  Please continue to work on smoking cessation  Pls call for colonoscopy and get it by February, we will refer AGAIN

## 2014-02-11 NOTE — Assessment & Plan Note (Signed)
Vaccine administered at visit.  

## 2014-02-11 NOTE — Assessment & Plan Note (Signed)
Controlled, no change in medication DASH diet and commitment to daily physical activity for a minimum of 30 minutes discussed and encouraged, as a part of hypertension management. The importance of attaining a healthy weight is also discussed.  

## 2014-02-11 NOTE — Assessment & Plan Note (Signed)
Detereiorated, up to 10 per day Patient counseled for approximately 5 minutes regarding the health risks of ongoing nicotine use, specifically all types of cancer, heart disease, stroke and respiratory failure. The options available for help with cessation ,the behavioral changes to assist the process, and the option to either gradully reduce usage  Or abruptly stop.is also discussed. Pt is also encouraged to set specific goals in number of cigarettes used daily, as well as to set a quit date.

## 2014-02-11 NOTE — Progress Notes (Signed)
   Subjective:    Patient ID: Christina Williamson, female    DOB: [birth year], 58 y.o.   MRN: [id number]  HPI    Review of Systems     Objective:   Physical Exam        Assessment & Plan:

## 2014-02-11 NOTE — Assessment & Plan Note (Signed)
Patient educated about the importance of limiting  Carbohydrate intake , the need to commit to daily physical activity for a minimum of 30 minutes , and to commit weight loss. The fact that changes in all these areas will reduce or eliminate all together the development of diabetes is stressed.   Updated lab needed at/ before next visit.  

## 2014-02-11 NOTE — Assessment & Plan Note (Signed)
Deteriorated. Patient re-educated about  the importance of commitment to a  minimum of 150 minutes of exercise per week. The importance of healthy food choices with portion control discussed. Encouraged to start a food diary, count calories and to consider  joining a support group. Sample diet sheets offered. Goals set by the patient for the next several months.    

## 2014-02-13 ENCOUNTER — Encounter (INDEPENDENT_AMBULATORY_CARE_PROVIDER_SITE_OTHER): Payer: Self-pay | Admitting: *Deleted

## 2014-02-15 ENCOUNTER — Ambulatory Visit: Payer: PRIVATE HEALTH INSURANCE | Admitting: Family Medicine

## 2014-03-05 ENCOUNTER — Other Ambulatory Visit: Payer: Self-pay | Admitting: Family Medicine

## 2014-03-05 ENCOUNTER — Telehealth: Payer: Self-pay | Admitting: *Deleted

## 2014-03-05 ENCOUNTER — Other Ambulatory Visit: Payer: Self-pay

## 2014-03-05 MED ORDER — PHENTERMINE HCL 37.5 MG PO TABS: 37.5000 mg | ORAL_TABLET | Freq: Every day | ORAL | Status: DC

## 2014-03-05 NOTE — Telephone Encounter (Signed)
Phentermine refilled.  Noted that Vitamin D is otc strength

## 2014-03-05 NOTE — Telephone Encounter (Signed)
Pt called stating she did not get her vitamin D or her weight loss pills. Please advise [phone]

## 2014-03-06 ENCOUNTER — Other Ambulatory Visit: Payer: Self-pay

## 2014-03-06 MED ORDER — TRIAMTERENE-HCTZ 75-50 MG PO TABS: 1.0000 | ORAL_TABLET | Freq: Every day | ORAL | Status: DC

## 2014-03-06 MED ORDER — BENAZEPRIL HCL 40 MG PO TABS: 40.0000 mg | ORAL_TABLET | Freq: Every day | ORAL | Status: DC

## 2014-03-06 MED ORDER — AMLODIPINE BESYLATE 10 MG PO TABS: 10.0000 mg | ORAL_TABLET | Freq: Every day | ORAL | Status: DC

## 2014-04-01 ENCOUNTER — Encounter: Payer: PRIVATE HEALTH INSURANCE | Admitting: Family Medicine

## 2014-06-12 LAB — CBC
HEMATOCRIT: 36 % (ref 36.0–46.0)
HEMOGLOBIN: 11.5 g/dL — AB (ref 12.0–15.0)
MCH: 30.1 pg (ref 26.0–34.0)
MCHC: 31.9 g/dL (ref 30.0–36.0)
MCV: 94.2 fL (ref 78.0–100.0)
MPV: 9.4 fL (ref 9.4–12.4)
Platelets: 271 10*3/uL (ref 150–400)
RBC: 3.82 MIL/uL — AB (ref 3.87–5.11)
RDW: 14.3 % (ref 11.5–15.5)
WBC: 6.6 10*3/uL (ref 4.0–10.5)

## 2014-06-12 LAB — LIPID PANEL
CHOLESTEROL: 173 mg/dL (ref 0–200)
HDL: 71 mg/dL (ref >=46)
LDL CALC: 88 mg/dL (ref 0–99)
TRIGLYCERIDES: 71 mg/dL (ref <150)
Total CHOL/HDL Ratio: 2.4 Ratio
VLDL: 14 mg/dL (ref 0–40)

## 2014-06-12 LAB — COMPREHENSIVE METABOLIC PANEL
ALBUMIN: 3.5 g/dL (ref 3.5–5.2)
ALT: 14 U/L (ref 0–35)
AST: 18 U/L (ref 0–37)
Alkaline Phosphatase: 95 U/L (ref 39–117)
BUN: 41 mg/dL — ABNORMAL HIGH (ref 6–23)
CALCIUM: 8.9 mg/dL (ref 8.4–10.5)
CHLORIDE: 106 meq/L (ref 96–112)
CO2: 25 meq/L (ref 19–32)
CREATININE: 2.14 mg/dL — AB (ref 0.50–1.10)
Glucose, Bld: 85 mg/dL (ref 70–99)
Potassium: 4.2 mEq/L (ref 3.5–5.3)
Sodium: 142 mEq/L (ref 135–145)
Total Bilirubin: 0.4 mg/dL (ref 0.2–1.2)
Total Protein: 6.7 g/dL (ref 6.0–8.3)

## 2014-06-12 LAB — HEMOGLOBIN A1C
Hgb A1c MFr Bld: 6.1 % — ABNORMAL HIGH (ref <5.7)
Mean Plasma Glucose: 128 mg/dL — ABNORMAL HIGH (ref <117)

## 2014-06-12 LAB — TSH: TSH: 2.142 u[IU]/mL (ref 0.350–4.500)

## 2014-06-17 ENCOUNTER — Other Ambulatory Visit (HOSPITAL_COMMUNITY)
Admission: RE | Admit: 2014-06-17 | Discharge: 2014-06-17 | Disposition: A | Discharge status: Home / Self Care | Payer: BLUE CROSS/BLUE SHIELD | Source: Ambulatory Visit | Attending: Family Medicine | Admitting: Family Medicine

## 2014-06-17 ENCOUNTER — Encounter: Payer: Self-pay | Admitting: Family Medicine

## 2014-06-17 ENCOUNTER — Ambulatory Visit (INDEPENDENT_AMBULATORY_CARE_PROVIDER_SITE_OTHER): Payer: BLUE CROSS/BLUE SHIELD | Admitting: Family Medicine

## 2014-06-17 VITALS — BP 120/80 | HR 85 | Resp 16 | Ht 64.0 in | Wt 180.0 lb

## 2014-06-17 DIAGNOSIS — R5382 Chronic fatigue, unspecified: Secondary | ICD-10-CM | POA: Diagnosis not present

## 2014-06-17 DIAGNOSIS — Z720 Tobacco use: Secondary | ICD-10-CM

## 2014-06-17 DIAGNOSIS — Z0000 Encounter for general adult medical examination without abnormal findings: Secondary | ICD-10-CM | POA: Diagnosis not present

## 2014-06-17 DIAGNOSIS — I10 Essential (primary) hypertension: Secondary | ICD-10-CM

## 2014-06-17 DIAGNOSIS — R5383 Other fatigue: Secondary | ICD-10-CM | POA: Insufficient documentation

## 2014-06-17 DIAGNOSIS — Z1151 Encounter for screening for human papillomavirus (HPV): Secondary | ICD-10-CM | POA: Insufficient documentation

## 2014-06-17 DIAGNOSIS — Z124 Encounter for screening for malignant neoplasm of cervix: Secondary | ICD-10-CM

## 2014-06-17 DIAGNOSIS — Z1211 Encounter for screening for malignant neoplasm of colon: Secondary | ICD-10-CM | POA: Diagnosis not present

## 2014-06-17 DIAGNOSIS — R7309 Other abnormal glucose: Secondary | ICD-10-CM

## 2014-06-17 DIAGNOSIS — IMO0001 Reserved for inherently not codable concepts without codable children: Secondary | ICD-10-CM

## 2014-06-17 DIAGNOSIS — Z01419 Encounter for gynecological examination (general) (routine) without abnormal findings: Secondary | ICD-10-CM | POA: Diagnosis not present

## 2014-06-17 DIAGNOSIS — R7303 Prediabetes: Secondary | ICD-10-CM

## 2014-06-17 DIAGNOSIS — F17200 Nicotine dependence, unspecified, uncomplicated: Secondary | ICD-10-CM

## 2014-06-17 LAB — POC HEMOCCULT BLD/STL (OFFICE/1-CARD/DIAGNOSTIC): Fecal Occult Blood, POC: NEGATIVE

## 2014-06-17 MED ORDER — PHENTERMINE HCL 37.5 MG PO TBDP: 1.0000 | ORAL_TABLET | Freq: Every day | ORAL | Status: DC

## 2014-06-17 NOTE — Patient Instructions (Addendum)
F/u in 4 month, call if you need me before  PLS GET colonoscopy done prior to return, glad you intend to follow through on this  STOP sweet tea, blood sugar ahs worsened, PLS commit to 64 ounces water daily  PLS commit to STOPPING smoking, as this increases  Your risk of heart attasck, stroke and all types of cancer  Call 1800 QUIT now, free advice all day to help stopping  If smoking 3 per day, should be able to set and keep quit date for May 1, just aim for that!  You are referred to nutritionist for help  Non fast chem 7 , HBa1C and vit D in 4 month  CXR and EKG in office today

## 2014-06-17 NOTE — Assessment & Plan Note (Signed)

## 2014-06-17 NOTE — Assessment & Plan Note (Signed)
Deteriorated Patient educated about the importance of limiting  Carbohydrate intake , the need to commit to daily physical activity for a minimum of 30 minutes , and to commit weight loss. The fact that changes in all these areas will reduce or eliminate all together the development of diabetes is stressed.   Diabetic Labs Latest Ref Rng 06/11/2014 02/12/2013 10/17/2012 10/17/2012 03/27/2012  HbA1c <5.7 % 6.1(H) 5.8(H) - 5.9(H) 6.1(H)  Chol 0 - 200 mg/dL 173 - - 197 -  HDL >=46 mg/dL 71 - - 71 -  Calc LDL 0 - 99 mg/dL 88 - - 107(H) -  Triglycerides <150 mg/dL 71 - - 95 -  Creatinine 0.50 - 1.10 mg/dL 2.14(H) 1.79(H) 1.93(H) 1.83(H) 1.69(H)   BP/Weight 06/17/2014 02/11/2014 07/02/2013 02/13/2013 10/17/2012 03/29/2012 [id number]  Systolic BP 123456 123456 [id number]  Diastolic BP 80 82 82 84 88 100 92  Wt. (Lbs) 180 183 181.04 195.8 188.8 193 191  BMI 30.88 31.67 31.33 33.88 32.67 33.4 33.05   No flowsheet data found.   Refer nutrtion ed

## 2014-06-17 NOTE — Assessment & Plan Note (Addendum)
C/o fatigue , needs EKG today, and also CXR EKG : Sinus rhythm, no ischemic changes, no LVH

## 2014-06-17 NOTE — Assessment & Plan Note (Signed)
Increased fatigue in pt with several CV risk factors, ; glucose intolerance, hyperlipidemia, nicotine use and hypertension EKG in office today: no evidence of ischemia ot LVH, SR

## 2014-06-17 NOTE — Progress Notes (Signed)
Subjective:    Patient ID: Christina Williamson, female    DOB: July 31, 1955, 59 y.o.   MRN: [id number]  HPI Patient is in for annual physical exam. No other health concerns are expressed or addressed at the visit. Recent labs, if available are reviewed. Immunization is reviewed , and  updated if needed.    Review of Systems See HPI     Objective:   Physical Exam BP 120/80 mmHg  Pulse 85  Resp 16  Ht 5\' 4"  (1.626 m)  Wt 180 lb (81.647 kg)  BMI 30.88 kg/m2  SpO2 98%  Pleasant obese female , alert and oriented in no c/p distress, TM clear, no sinus tenderness, EOMU, no facial assymetry. Afebrile. HEENT No facial trauma or asymetry. Sinuses non tender.  Extra occullar muscles intact, pupils equally reactive to light. External ears normal, tympanic membranes clear. Oropharynx moist, no exudate, edentulous, upper and lower plates Neck: supple, no adenopathy,JVD or thyromegaly.No bruits.  Chest: Clear to ascultation bilaterally.No crackles or wheezes. Non tender to palpation  Breast: No asymetry,no masses or lumps. No tenderness. No nipple discharge or inversion. No axillary or supraclavicular adenopathy  Cardiovascular system; Heart sounds normal,  S1 and  S2 ,no S3.  No murmur, or thrill. Apical beat not displaced Peripheral pulses normal.  Abdomen: Soft, non tender, no organomegaly or masses. No bruits. Bowel sounds normal. No guarding, tenderness or rebound.  Rectal:  Normal sphincter tone. No mass.No rectal masses.  Guaiac negative stool.  GU: External genitalia normal female genitalia , female distribution of hair. No lesions. Urethral meatus normal in size, no  Prolapse, no lesions visibly  Present. Bladder non tender. Vagina pink and moist , with no visible lesions , discharge present . Adequate pelvic support no  cystocele or rectocele noted  Uterus absent, no adnexal masses, no  adnexal tenderness.   Musculoskeletal exam: Full ROM of spine, hips ,  shoulders and knees. No deformity ,swelling or crepitus noted. No muscle wasting or atrophy.   Neurologic: Cranial nerves 2 to 12 intact. Power, tone ,sensation and reflexes normal throughout. No disturbance in gait. No tremor.  Skin: Intact, no ulceration, erythema , scaling or rash noted. Pigmentation normal throughout  Psych; Normal mood and affect. Judgement and concentration normal        Assessment & Plan:  Prediabetes Deteriorated Patient educated about the importance of limiting  Carbohydrate intake , the need to commit to daily physical activity for a minimum of 30 minutes , and to commit weight loss. The fact that changes in all these areas will reduce or eliminate all together the development of diabetes is stressed.   Diabetic Labs Latest Ref Rng 06/11/2014 02/12/2013 10/17/2012 10/17/2012 03/27/2012  HbA1c <5.7 % 6.1(H) 5.8(H) - 5.9(H) 6.1(H)  Chol 0 - 200 mg/dL 173 - - 197 -  HDL >=46 mg/dL 71 - - 71 -  Calc LDL 0 - 99 mg/dL 88 - - 107(H) -  Triglycerides <150 mg/dL 71 - - 95 -  Creatinine 0.50 - 1.10 mg/dL 2.14(H) 1.79(H) 1.93(H) 1.83(H) 1.69(H)   BP/Weight 06/17/2014 02/11/2014 07/02/2013 02/13/2013 10/17/2012 03/29/2012 [id number]  Systolic BP 123456 123456 [id number]  Diastolic BP 80 82 82 84 88 100 92  Wt. (Lbs) 180 183 181.04 195.8 188.8 193 191  BMI 30.88 31.67 31.33 33.88 32.67 33.4 33.05   No flowsheet data found.   Refer nutrtion ed   Essential hypertension C/o fatigue , needs EKG today, and also CXR EKG :  Sinus rhythm, no ischemic changes, no LVH   Annual physical exam Annual exam as documented. Counseling done  re healthy lifestyle involving commitment to 150 minutes exercise per week, heart healthy diet, and attaining healthy weight.The importance of adequate sleep also discussed. Regular seat belt use and home safety, is also discussed. Changes in health habits are decided on by the patient with goals and time frames  set for achieving  them. Immunization and cancer screening needs are specifically addressed at this visit.    Fatigue Increased fatigue in pt with several CV risk factors, ; glucose intolerance, hyperlipidemia, nicotine use and hypertension EKG in office today: no evidence of ischemia ot LVH, SR   NICOTINE ADDICTION Patient counseled for approximately 5 minutes regarding the health risks of ongoing nicotine use, specifically all types of cancer, heart disease, stroke and respiratory failure. The options available for help with cessation ,the behavioral changes to assist the process, and the option to either gradully reduce usage  Or abruptly stop.is also discussed. Pt is also encouraged to set specific goals in number of cigarettes used daily, as well as to set a quit date.  Number of cigarettes/cigars currently smoking daily: 5

## 2014-06-17 NOTE — Assessment & Plan Note (Signed)

## 2014-06-18 ENCOUNTER — Telehealth: Payer: Self-pay | Admitting: Family Medicine

## 2014-06-18 LAB — CYTOLOGY - PAP

## 2014-06-18 NOTE — Telephone Encounter (Signed)
Patient aware that [facility] Chart messages were appointment reminders.

## 2014-09-09 ENCOUNTER — Ambulatory Visit: Payer: BLUE CROSS/BLUE SHIELD | Admitting: Nutrition

## 2014-10-07 ENCOUNTER — Other Ambulatory Visit: Payer: Self-pay | Admitting: Family Medicine

## 2014-10-15 ENCOUNTER — Ambulatory Visit (INDEPENDENT_AMBULATORY_CARE_PROVIDER_SITE_OTHER): Payer: BLUE CROSS/BLUE SHIELD | Admitting: Family Medicine

## 2014-10-15 ENCOUNTER — Other Ambulatory Visit (INDEPENDENT_AMBULATORY_CARE_PROVIDER_SITE_OTHER): Payer: Self-pay | Admitting: *Deleted

## 2014-10-15 ENCOUNTER — Encounter: Payer: Self-pay | Admitting: Family Medicine

## 2014-10-15 ENCOUNTER — Telehealth: Payer: Self-pay | Admitting: *Deleted

## 2014-10-15 VITALS — BP 122/84 | HR 80 | Resp 16 | Ht 64.0 in | Wt 181.0 lb

## 2014-10-15 DIAGNOSIS — R7309 Other abnormal glucose: Secondary | ICD-10-CM

## 2014-10-15 DIAGNOSIS — Z1231 Encounter for screening mammogram for malignant neoplasm of breast: Secondary | ICD-10-CM | POA: Diagnosis not present

## 2014-10-15 DIAGNOSIS — Z720 Tobacco use: Secondary | ICD-10-CM

## 2014-10-15 DIAGNOSIS — R7303 Prediabetes: Secondary | ICD-10-CM

## 2014-10-15 DIAGNOSIS — N183 Chronic kidney disease, stage 3 unspecified: Secondary | ICD-10-CM

## 2014-10-15 DIAGNOSIS — I10 Essential (primary) hypertension: Secondary | ICD-10-CM

## 2014-10-15 DIAGNOSIS — F17200 Nicotine dependence, unspecified, uncomplicated: Secondary | ICD-10-CM

## 2014-10-15 DIAGNOSIS — Z1211 Encounter for screening for malignant neoplasm of colon: Secondary | ICD-10-CM

## 2014-10-15 DIAGNOSIS — E669 Obesity, unspecified: Secondary | ICD-10-CM

## 2014-10-15 LAB — BASIC METABOLIC PANEL
BUN: 34 mg/dL — ABNORMAL HIGH (ref 7–25)
CALCIUM: 9.2 mg/dL (ref 8.6–10.4)
CO2: 27 mmol/L (ref 20–31)
Chloride: 105 mmol/L (ref 98–110)
Creat: 1.75 mg/dL — ABNORMAL HIGH (ref 0.50–1.05)
Glucose, Bld: 77 mg/dL (ref 65–99)
Potassium: 4.3 mmol/L (ref 3.5–5.3)
SODIUM: 142 mmol/L (ref 135–146)

## 2014-10-15 LAB — VITAMIN D 25 HYDROXY (VIT D DEFICIENCY, FRACTURES): VIT D 25 HYDROXY: 18 ng/mL — AB (ref 30–100)

## 2014-10-15 LAB — HEMOGLOBIN A1C
Hgb A1c MFr Bld: 5.8 % — ABNORMAL HIGH (ref <5.7)
MEAN PLASMA GLUCOSE: 120 mg/dL — AB (ref <117)

## 2014-10-15 MED ORDER — BENAZEPRIL HCL 40 MG PO TABS: 40.0000 mg | ORAL_TABLET | Freq: Every day | ORAL | Status: DC

## 2014-10-15 MED ORDER — TRIAMTERENE-HCTZ 75-50 MG PO TABS
1.0000 | ORAL_TABLET | Freq: Every day | ORAL | Status: DC
Start: 2014-10-15 — End: 2015-06-20

## 2014-10-15 MED ORDER — AMLODIPINE BESYLATE 10 MG PO TABS: 10.0000 mg | ORAL_TABLET | Freq: Every day | ORAL | Status: DC

## 2014-10-15 NOTE — Patient Instructions (Addendum)
F/u in January, call if you need me before   Non fast chem 7 and EGFr, hBA1C in January  It is important that you exercise regularly at least 30 minutes 5 times a week. If you develop chest pain, have severe difficulty breathing, or feel very tired, stop exercising immediately and seek medical attention    A healthy diet is rich in fruit, vegetables and whole grains. Poultry fish, nuts and beans are a healthy choice for protein rather then red meat. A low sodium diet and drinking 64 ounces of water daily is generally recommended. Oils and sweet should be limited. Carbohydrates especially for those who are diabetic or overweight, should be limited to 45 to 60 gram per meal. It is important to eat on a regular schedule, at least 3 times daily. Snacks should be primarily fruits, vegetables or nuts.    STOP phentermine  We will schedule   Mammogram for Monday or Tuesday  PLEASE follow through on colonoscopy

## 2014-10-15 NOTE — Telephone Encounter (Signed)
Pt is scheduled for 10/21/14 at 12:00 at [facility]. Pt is aware.

## 2014-10-15 NOTE — Telephone Encounter (Signed)
Noted  

## 2014-10-16 ENCOUNTER — Ambulatory Visit: Payer: BLUE CROSS/BLUE SHIELD | Admitting: Family Medicine

## 2014-10-18 MED ORDER — VITAMIN D (ERGOCALCIFEROL) 1.25 MG (50000 UNIT) PO CAPS: 50000.0000 [IU] | ORAL_CAPSULE | ORAL | Status: DC

## 2014-10-18 NOTE — Addendum Note (Signed)
Addended by: Denman George B on: 10/18/2014 10:59 AM   Modules accepted: Orders

## 2014-10-21 ENCOUNTER — Ambulatory Visit: Payer: BLUE CROSS/BLUE SHIELD | Admitting: Nutrition

## 2014-10-21 ENCOUNTER — Ambulatory Visit (HOSPITAL_COMMUNITY): Payer: BLUE CROSS/BLUE SHIELD

## 2014-10-21 ENCOUNTER — Other Ambulatory Visit: Payer: Self-pay | Admitting: Family Medicine

## 2014-10-21 DIAGNOSIS — Z1231 Encounter for screening mammogram for malignant neoplasm of breast: Secondary | ICD-10-CM

## 2014-11-05 ENCOUNTER — Encounter: Payer: Self-pay | Admitting: Family Medicine

## 2014-11-05 NOTE — Assessment & Plan Note (Signed)
Deteriorated. Patient re-educated about  the importance of commitment to a  minimum of 150 minutes of exercise per week.  The importance of healthy food choices with portion control discussed. Encouraged to start a food diary, count calories and to consider  joining a support group. Sample diet sheets offered. Goals set by the patient for the next several months.   Weight /BMI 10/15/2014 06/17/2014 02/11/2014  WEIGHT 181 lb 180 lb 183 lb  HEIGHT 5\' 4"  5\' 4"  5' 3.75"  BMI 31.05 kg/m2 30.88 kg/m2 31.67 kg/m2    Current exercise per week 60 minutes.

## 2014-11-05 NOTE — Assessment & Plan Note (Signed)
Improved with blood pressure control, lifestyle changes and avoidance of NSAIDS. Needs to stop smoking

## 2014-11-05 NOTE — Assessment & Plan Note (Signed)
Controlled, no change in medication DASH diet and commitment to daily physical activity for a minimum of 30 minutes discussed and encouraged, as a part of hypertension management. The importance of attaining a healthy weight is also discussed.  BP/Weight 10/15/2014 06/17/2014 02/11/2014 07/02/2013 02/13/2013 10/17/2012 [id number]  Systolic BP 123XX123 123456 [id number]  Diastolic BP 84 80 82 82 84 88 100  Wt. (Lbs) 181 180 183 181.04 195.8 188.8 193  BMI 31.05 30.88 31.67 31.33 33.88 32.67 33.4

## 2014-11-05 NOTE — Assessment & Plan Note (Signed)

## 2014-11-05 NOTE — Assessment & Plan Note (Signed)
Patient educated about the importance of limiting  Carbohydrate intake , the need to commit to daily physical activity for a minimum of 30 minutes , and to commit weight loss. The fact that changes in all these areas will reduce or eliminate all together the development of diabetes is stressed.  Improved  Diabetic Labs Latest Ref Rng 10/14/2014 06/11/2014 02/12/2013 10/17/2012 10/17/2012  HbA1c <5.7 % 5.8(H) 6.1(H) 5.8(H) - 5.9(H)  Chol 0 - 200 mg/dL - 173 - - 197  HDL >=46 mg/dL - 71 - - 71  Calc LDL 0 - 99 mg/dL - 88 - - 107(H)  Triglycerides <150 mg/dL - 71 - - 95  Creatinine 0.50 - 1.05 mg/dL 1.75(H) 2.14(H) 1.79(H) 1.93(H) 1.83(H)   BP/Weight 10/15/2014 06/17/2014 02/11/2014 07/02/2013 02/13/2013 10/17/2012 [id number]  Systolic BP 123XX123 123456 123456 [id number]  Diastolic BP 84 80 82 82 84 88 100  Wt. (Lbs) 181 180 183 181.04 195.8 188.8 193  BMI 31.05 30.88 31.67 31.33 33.88 32.67 33.4   No flowsheet data found.

## 2014-11-05 NOTE — Progress Notes (Signed)
Subjective:    Patient ID: Christina Williamson, female    DOB: 1955/10/28, 59 y.o.   MRN: [id number]  HPI The PT is here for follow up and re-evaluation of chronic medical conditions, medication management and review of any available recent lab and radiology data.  Preventive health is updated, specifically  Cancer screening and Immunization.   Questions or concerns regarding consultations or procedures which the PT has had in the interim are  addressed. The PT denies any adverse reactions to current medications since the last visit.  There are no new concerns.  There are no specific complaints       Review of Systems See HPI Denies recent fever or chills. Denies sinus pressure, nasal congestion, ear pain or sore throat. Denies chest congestion, productive cough or wheezing. Denies chest pains, palpitations and leg swelling Denies abdominal pain, nausea, vomiting,diarrhea or constipation.   Denies dysuria, frequency, hesitancy or incontinence. Denies joint pain, swelling and limitation in mobility. Denies headaches, seizures, numbness, or tingling. Denies depression, anxiety or insomnia. Denies skin break down or rash.        Objective:   Physical Exam BP 122/84 mmHg  Pulse 80  Resp 16  Ht 5\' 4"  (1.626 m)  Wt 181 lb (82.101 kg)  BMI 31.05 kg/m2  SpO2 100% Patient alert and oriented and in no cardiopulmonary distress.  HEENT: No facial asymmetry, EOMI,   oropharynx pink and moist.  Neck supple no JVD, no mass.  Chest: Clear to auscultation bilaterally.  CVS: S1, S2 no murmurs, no S3.Regular rate.  ABD: Soft non tender.   Ext: No edema  MS: Adequate ROM spine, shoulders, hips and knees.  Skin: Intact, no ulcerations or rash noted.  Psych: Good eye contact, normal affect. Memory intact not anxious or depressed appearing.  CNS: CN 2-12 intact, power,  normal throughout.no focal deficits noted.        Assessment & Plan:  Essential hypertension Controlled,  no change in medication DASH diet and commitment to daily physical activity for a minimum of 30 minutes discussed and encouraged, as a part of hypertension management. The importance of attaining a healthy weight is also discussed.  BP/Weight 10/15/2014 06/17/2014 02/11/2014 07/02/2013 02/13/2013 10/17/2012 [id number]  Systolic BP 123XX123 123456 123456 [id number]  Diastolic BP 84 80 82 82 84 88 100  Wt. (Lbs) 181 180 183 181.04 195.8 188.8 193  BMI 31.05 30.88 31.67 31.33 33.88 32.67 33.4        Prediabetes Patient educated about the importance of limiting  Carbohydrate intake , the need to commit to daily physical activity for a minimum of 30 minutes , and to commit weight loss. The fact that changes in all these areas will reduce or eliminate all together the development of diabetes is stressed.  Improved  Diabetic Labs Latest Ref Rng 10/14/2014 06/11/2014 02/12/2013 10/17/2012 10/17/2012  HbA1c <5.7 % 5.8(H) 6.1(H) 5.8(H) - 5.9(H)  Chol 0 - 200 mg/dL - 173 - - 197  HDL >=46 mg/dL - 71 - - 71  Calc LDL 0 - 99 mg/dL - 88 - - 107(H)  Triglycerides <150 mg/dL - 71 - - 95  Creatinine 0.50 - 1.05 mg/dL 1.75(H) 2.14(H) 1.79(H) 1.93(H) 1.83(H)   BP/Weight 10/15/2014 06/17/2014 02/11/2014 07/02/2013 02/13/2013 10/17/2012 [id number]  Systolic BP 123XX123 123456 123456 [id number]  Diastolic BP 84 80 82 82 84 88 100  Wt. (Lbs) 181 180 183 181.04 195.8 188.8 193  BMI 31.05 30.88 31.67 31.33  33.88 32.67 33.4   No flowsheet data found.     Obesity (BMI 30.0-34.9) Deteriorated. Patient re-educated about  the importance of commitment to a  minimum of 150 minutes of exercise per week.  The importance of healthy food choices with portion control discussed. Encouraged to start a food diary, count calories and to consider  joining a support group. Sample diet sheets offered. Goals set by the patient for the next several months.   Weight /BMI 10/15/2014 06/17/2014 02/11/2014  WEIGHT 181 lb 180 lb 183 lb  HEIGHT 5'  4" 5\' 4"  5' 3.75"  BMI 31.05 kg/m2 30.88 kg/m2 31.67 kg/m2    Current exercise per week 60 minutes.   CKD (chronic kidney disease) stage 3, GFR 30-59 ml/min Improved with blood pressure control, lifestyle changes and avoidance of NSAIDS. Needs to stop smoking  NICOTINE ADDICTION Patient counseled for approximately 5 minutes regarding the health risks of ongoing nicotine use, specifically all types of cancer, heart disease, stroke and respiratory failure. The options available for help with cessation ,the behavioral changes to assist the process, and the option to either gradully reduce usage  Or abruptly stop.is also discussed. Pt is also encouraged to set specific goals in number of cigarettes used daily, as well as to set a quit date.  Number of cigarettes/cigars currently smoking daily: 6*

## 2014-11-11 ENCOUNTER — Telehealth: Payer: Self-pay | Admitting: Nutrition

## 2014-11-11 NOTE — Telephone Encounter (Signed)
VM to call to reschedule missed appointment. PC

## 2014-11-18 ENCOUNTER — Ambulatory Visit (HOSPITAL_COMMUNITY): Payer: BLUE CROSS/BLUE SHIELD

## 2014-11-20 ENCOUNTER — Telehealth: Payer: Self-pay | Admitting: Family Medicine

## 2014-11-20 NOTE — Telephone Encounter (Signed)
pls call pt, message sent stating has shoulder pain  Can she make appt. May better be served seeing ortho, if after spking with  Her you feel we can address sched for next weekif possible , if ortho the better optio pls refer I will sign

## 2014-11-21 MED ORDER — PREDNISONE 5 MG PO TABS: 5.0000 mg | ORAL_TABLET | Freq: Two times a day (BID) | ORAL | Status: DC

## 2014-11-21 NOTE — Telephone Encounter (Signed)
Patient aware and med sent to pharmacy.  Will call back if she needs a referral to ortho

## 2014-11-21 NOTE — Telephone Encounter (Addendum)
I recommend pred 5 mg one twice daily for 5 days pls send, if this does not give relief needs to call for referral to ortho, options in [place] and [place] I will sign referral

## 2014-11-21 NOTE — Telephone Encounter (Signed)
Patient states that she is having right elbow pain x 2 weeks.  No acute injury.  She states that she does note a knot on her right elbow that is tender but no drainage.  Please advise.

## 2014-11-21 NOTE — Addendum Note (Signed)
Addended by: Denman George B on: 11/21/2014 01:30 PM   Modules accepted: Orders

## 2014-12-11 ENCOUNTER — Telehealth (INDEPENDENT_AMBULATORY_CARE_PROVIDER_SITE_OTHER): Payer: Self-pay | Admitting: *Deleted

## 2014-12-11 DIAGNOSIS — Z1211 Encounter for screening for malignant neoplasm of colon: Secondary | ICD-10-CM

## 2014-12-11 NOTE — Telephone Encounter (Signed)
Patient needs suprep 

## 2014-12-12 MED ORDER — SUPREP BOWEL PREP KIT 17.5-3.13-1.6 GM/177ML PO SOLN: 1.0000 | Freq: Once | ORAL | Status: DC

## 2014-12-30 ENCOUNTER — Telehealth (INDEPENDENT_AMBULATORY_CARE_PROVIDER_SITE_OTHER): Payer: Self-pay | Admitting: *Deleted

## 2014-12-30 NOTE — Telephone Encounter (Signed)
agree

## 2014-12-30 NOTE — Telephone Encounter (Signed)
Referring MD/PCP: simpson   Procedure: tcs  Reason/Indication:  screening  Has patient had this procedure before?  no  If so, when, by whom and where?    Is there a family history of colon cancer?  no  Who?  What age when diagnosed?    Is patient diabetic?   no      Does patient have prosthetic heart valve or mechanical valve?  no  Do you have a pacemaker?  no  Has patient ever had endocarditis? no  Has patient had joint replacement within last 12 months?  no  Does patient tend to be constipated or take laxatives? no  Does patient have a history of alcohol/drug use?  no  Is patient on Coumadin, Plavix and/or Aspirin? no  Medications: amlodipine 10 mg daily, benazepril 40 mg daily, cholecalciferol 1000 units daily, triamterene/hctz 75/50 mg daily  Allergies: nkda  Medication Adjustment:   Procedure date & time: 01/29/15 at 730

## 2015-01-27 ENCOUNTER — Telehealth: Payer: Self-pay | Admitting: Family Medicine

## 2015-01-27 ENCOUNTER — Encounter (INDEPENDENT_AMBULATORY_CARE_PROVIDER_SITE_OTHER): Payer: Self-pay | Admitting: *Deleted

## 2015-01-27 NOTE — Telephone Encounter (Signed)
Called and left message for patient to return call.  

## 2015-01-27 NOTE — Telephone Encounter (Signed)
Christina Williamson has questions about her medication, please advise?

## 2015-01-29 ENCOUNTER — Encounter (HOSPITAL_COMMUNITY): Payer: Self-pay | Admitting: *Deleted

## 2015-01-29 ENCOUNTER — Encounter (HOSPITAL_COMMUNITY)
Admission: RE | Disposition: A | Discharge status: Home Health | Payer: Self-pay | Source: Ambulatory Visit | Attending: Internal Medicine

## 2015-01-29 ENCOUNTER — Ambulatory Visit (HOSPITAL_COMMUNITY)
Admission: RE | Admit: 2015-01-29 | Discharge: 2015-01-29 | Disposition: A | Discharge status: Home Health | Payer: BLUE CROSS/BLUE SHIELD | Source: Ambulatory Visit | Attending: Internal Medicine | Admitting: Internal Medicine

## 2015-01-29 DIAGNOSIS — Z8249 Family history of ischemic heart disease and other diseases of the circulatory system: Secondary | ICD-10-CM | POA: Diagnosis not present

## 2015-01-29 DIAGNOSIS — D120 Benign neoplasm of cecum: Secondary | ICD-10-CM | POA: Insufficient documentation

## 2015-01-29 DIAGNOSIS — E669 Obesity, unspecified: Secondary | ICD-10-CM | POA: Insufficient documentation

## 2015-01-29 DIAGNOSIS — I10 Essential (primary) hypertension: Secondary | ICD-10-CM | POA: Diagnosis not present

## 2015-01-29 DIAGNOSIS — Z880 Allergy status to penicillin: Secondary | ICD-10-CM | POA: Diagnosis not present

## 2015-01-29 DIAGNOSIS — Z79899 Other long term (current) drug therapy: Secondary | ICD-10-CM | POA: Insufficient documentation

## 2015-01-29 DIAGNOSIS — F17200 Nicotine dependence, unspecified, uncomplicated: Secondary | ICD-10-CM | POA: Insufficient documentation

## 2015-01-29 DIAGNOSIS — D123 Benign neoplasm of transverse colon: Secondary | ICD-10-CM | POA: Diagnosis not present

## 2015-01-29 DIAGNOSIS — Z1211 Encounter for screening for malignant neoplasm of colon: Secondary | ICD-10-CM | POA: Diagnosis not present

## 2015-01-29 DIAGNOSIS — E785 Hyperlipidemia, unspecified: Secondary | ICD-10-CM | POA: Diagnosis not present

## 2015-01-29 DIAGNOSIS — Z90710 Acquired absence of both cervix and uterus: Secondary | ICD-10-CM | POA: Insufficient documentation

## 2015-01-29 DIAGNOSIS — K5730 Diverticulosis of large intestine without perforation or abscess without bleeding: Secondary | ICD-10-CM | POA: Insufficient documentation

## 2015-01-29 HISTORY — PX: COLONOSCOPY: SHX5424

## 2015-01-29 SURGERY — COLONOSCOPY
Anesthesia: Moderate Sedation

## 2015-01-29 MED ORDER — STERILE WATER FOR IRRIGATION IR SOLN
Status: DC | PRN
  Administered 2015-01-29: 08:00:00

## 2015-01-29 MED ORDER — MIDAZOLAM HCL 5 MG/5ML IJ SOLN
INTRAMUSCULAR | Status: AC
  Filled 2015-01-29: qty 10

## 2015-01-29 MED ORDER — MIDAZOLAM HCL 5 MG/5ML IJ SOLN
INTRAMUSCULAR | Status: DC | PRN
  Administered 2015-01-29: 2 mg via INTRAVENOUS
  Administered 2015-01-29 (×3): 1 mg via INTRAVENOUS

## 2015-01-29 MED ORDER — MEPERIDINE HCL 50 MG/ML IJ SOLN
INTRAMUSCULAR | Status: DC | PRN
  Administered 2015-01-29 (×2): 25 mg via INTRAVENOUS

## 2015-01-29 MED ORDER — SODIUM CHLORIDE 0.9 % IV SOLN
INTRAVENOUS | Status: DC
  Administered 2015-01-29: 1000 mL via INTRAVENOUS

## 2015-01-29 MED ORDER — MEPERIDINE HCL 50 MG/ML IJ SOLN
INTRAMUSCULAR | Status: AC
  Filled 2015-01-29: qty 1

## 2015-01-29 NOTE — H&P (Signed)
Christina Williamson is an 59 y.o. female.   Chief Complaint: Patient is here for colonoscopy. HPI: Patient is 59 year old African-American female who is in for screening examination. She denies abdominal pain change in bowel habits or rectal bleeding. Family history is negative for CRC.  Past Medical History  Diagnosis Date  . Nicotine addiction   . Obesity   . Hypertension   . Hyperlipidemia     Past Surgical History  Procedure Laterality Date  . Partial hysterectomy  2002    secondary to fibroids     Family History  Problem Relation Age of Onset  . Cancer Mother     breast  . Hypertension Mother   . Stroke Mother   . Hypertension Father   . Diabetes Father   . Stroke Father   . Diabetes Brother   . Diabetes Brother   . Hypertension Brother   . Hypertension Brother   . Hypertension Brother    Social History:  reports that she has been smoking Cigarettes.  She does not have any smokeless tobacco history on file. She reports that she does not drink alcohol or use illicit drugs.  Allergies:  Allergies  Allergen Reactions  . Ace Inhibitors Other (See Comments)    Patient cannot remember reaction.  Marland Kitchen Penicillins     Has patient had a PCN reaction causing immediate rash, facial/tongue/throat swelling, SOB or lightheadedness with hypotension: No Has patient had a PCN reaction causing severe rash involving mucus membranes or skin necrosis: No Has patient had a PCN reaction that required hospitalization No Has patient had a PCN reaction occurring within the last 10 years: No If all of the above answers are "NO", then may proceed with Cephalosporin use.     Medications Prior to Admission  Medication Sig Dispense Refill  . amLODipine (NORVASC) 10 MG tablet Take 1 tablet (10 mg total) by mouth daily. 30 tablet 4  . benazepril (LOTENSIN) 40 MG tablet Take 1 tablet (40 mg total) by mouth daily. 30 tablet 4  . SUPREP BOWEL PREP SOLN Take 1 kit by mouth once. 1 Bottle 0  .  triamterene-hydrochlorothiazide (MAXZIDE) 75-50 MG per tablet Take 1 tablet by mouth daily. 30 tablet 4  . Vitamin D, Ergocalciferol, (DRISDOL) 50000 UNITS CAPS capsule Take 1 capsule (50,000 Units total) by mouth every 7 (seven) days. 4 capsule 5  . predniSONE (DELTASONE) 5 MG tablet Take 1 tablet (5 mg total) by mouth 2 (two) times daily with a meal. (Patient not taking: Reported on 01/27/2015) 10 tablet 0    No results found for this or any previous visit (from the past 48 hour(s)). No results found.  ROS  Blood pressure 141/71, pulse 68, temperature 98 F (36.7 C), temperature source Oral, resp. rate 21, height '5\' 3"'  (1.6 m), weight 187 lb (84.823 kg), SpO2 98 %. Physical Exam  Constitutional: She appears well-developed and well-nourished.  HENT:  Mouth/Throat: Oropharynx is clear and moist.  Eyes: Conjunctivae are normal. No scleral icterus.  Neck: No thyromegaly present.  Cardiovascular: Normal rate, regular rhythm and normal heart sounds.   No murmur heard. Respiratory: Effort normal and breath sounds normal.  GI: Soft. She exhibits no distension and no mass. There is no tenderness.  Musculoskeletal: She exhibits no edema.  Lymphadenopathy:    She has no cervical adenopathy.  Neurological: She is alert.  Skin: Skin is warm and dry.     Assessment/Plan Average risk screening colonoscopy.  Shed Nixon U 01/29/2015, 7:30 AM

## 2015-01-29 NOTE — Telephone Encounter (Signed)
Called and left message again that is she still has questions regarding her meds to call me back at the office

## 2015-01-29 NOTE — Op Note (Signed)
COLONOSCOPY PROCEDURE REPORT  PATIENT:  Christina Williamson  MR#:  [id number] Birthdate:  Apr 11, 1955, 59 y.o., female Endoscopist:  Dr. Rogene Houston, MD Referred By:  Dr. Tula Nakayama, MD Procedure Date: 01/29/2015  Procedure:   Colonoscopy  Indications:  Average risk screening colonoscopy.                       First exam.  Informed Consent:  The procedure and risks were reviewed with the patient and informed consent was obtained.  Medications:  Demerol 50 mg IV Versed 5 mg IV  Description of procedure:  After a digital rectal exam was performed, that colonoscope was advanced from the anus through the rectum and colon to the area of the cecum, ileocecal valve and appendiceal orifice. The cecum was deeply intubated. These structures were well-seen and photographed for the record. From the level of the cecum and ileocecal valve, the scope was slowly and cautiously withdrawn. The mucosal surfaces were carefully surveyed utilizing scope tip to flexion to facilitate fold flattening as needed. The scope was pulled down into the rectum where a thorough exam including retroflexion was performed.  Findings:   Prep excellent. Small polyp ablated via cold biopsy from cecum. 4 mm polyp cold snare from proximal transverse colon. Both of these polyps were submitted together. Few diverticula at transverse, descending and sigmoid colon. Normal rectal mucosa and anal rectal junction.   Therapeutic/Diagnostic Maneuvers Performed:  See above  Complications:  None  EBL: Minimal  Cecal Withdrawal Time:  13 minutes  Impression:  Examination performed to cecum. Two small polyps removed as above and submitted together(cecum and proximal transverse colon). Few scattered diverticula at transverse descending and sigmoid colon..  Recommendations:  Standard instructions given. High-fiber diet I will contact patient with biopsy results and further recommendations.  REHMAN,NAJEEB U  01/29/2015  8:13 AM  CC: Dr. Tula Nakayama, MD & Dr. Rayne Du ref. provider found

## 2015-01-29 NOTE — Discharge Instructions (Signed)
Resume usual medications and high fiber diet. No driving for 24 hours. Physician will call with biopsy results.  Colonoscopy, Care After Refer to this sheet in the next few weeks. These instructions provide you with information on caring for yourself after your procedure. Your health care provider may also give you more specific instructions. Your treatment has been planned according to current medical practices, but problems sometimes occur. Call your health care provider if you have any problems or questions after your procedure. WHAT TO EXPECT AFTER THE PROCEDURE  After your procedure, it is typical to have the following:  A small amount of blood in your stool.  Moderate amounts of gas and mild abdominal cramping or bloating. HOME CARE INSTRUCTIONS  Do not drive, operate machinery, or sign important documents for 24 hours.  You may shower and resume your regular physical activities, but move at a slower pace for the first 24 hours.  Take frequent rest periods for the first 24 hours.  Walk around or put a warm pack on your abdomen to help reduce abdominal cramping and bloating.  Drink enough fluids to keep your urine clear or pale yellow.  You may resume your normal diet as instructed by your health care provider. Avoid heavy or fried foods that are hard to digest.  Avoid drinking alcohol for 24 hours or as instructed by your health care provider.  Only take over-the-counter or prescription medicines as directed by your health care provider.  If a tissue sample (biopsy) was taken during your procedure:  Do not take aspirin or blood thinners for 7 days, or as instructed by your health care provider.  Do not drink alcohol for 7 days, or as instructed by your health care provider.  Eat soft foods for the first 24 hours. SEEK MEDICAL CARE IF: You have persistent spotting of blood in your stool 2-3 days after the procedure. SEEK IMMEDIATE MEDICAL CARE IF:  You have more than a  small spotting of blood in your stool.  You pass large blood clots in your stool.  Your abdomen is swollen (distended).  You have nausea or vomiting.  You have a fever.  You have increasing abdominal pain that is not relieved with medicine.   This information is not intended to replace advice given to you by your health care provider. Make sure you discuss any questions you have with your health care provider.   Colon Polyps Polyps are lumps of extra tissue growing inside the body. Polyps can grow in the large intestine (colon). Most colon polyps are noncancerous (benign). However, some colon polyps can become cancerous over time. Polyps that are larger than a pea may be harmful. To be safe, caregivers remove and test all polyps. CAUSES  Polyps form when mutations in the genes cause your cells to grow and divide even though no more tissue is needed. RISK FACTORS There are a number of risk factors that can increase your chances of getting colon polyps. They include:  Being older than 50 years.  Family history of colon polyps or colon cancer.  Long-term colon diseases, such as colitis or Crohn disease.  Being overweight.  Smoking.  Being inactive.  Drinking too much alcohol. SYMPTOMS  Most small polyps do not cause symptoms. If symptoms are present, they may include:  Blood in the stool. The stool may look dark red or black.  Constipation or diarrhea that lasts longer than 1 week. DIAGNOSIS People often do not know they have polyps until their caregiver  finds them during a regular checkup. Your caregiver can use 4 tests to check for polyps:  Digital rectal exam. The caregiver wears gloves and feels inside the rectum. This test would find polyps only in the rectum.  Barium enema. The caregiver puts a liquid called barium into your rectum before taking X-rays of your colon. Barium makes your colon look white. Polyps are dark, so they are easy to see in the X-ray  pictures.  Sigmoidoscopy. A thin, flexible tube (sigmoidoscope) is placed into your rectum. The sigmoidoscope has a light and tiny camera in it. The caregiver uses the sigmoidoscope to look at the last third of your colon.  Colonoscopy. This test is like sigmoidoscopy, but the caregiver looks at the entire colon. This is the most common method for finding and removing polyps. TREATMENT  Any polyps will be removed during a sigmoidoscopy or colonoscopy. The polyps are then tested for cancer. PREVENTION  To help lower your risk of getting more colon polyps:  Eat plenty of fruits and vegetables. Avoid eating fatty foods.  Do not smoke.  Avoid drinking alcohol.  Exercise every day.  Lose weight if recommended by your caregiver.  Eat plenty of calcium and folate. Foods that are rich in calcium include milk, cheese, and broccoli. Foods that are rich in folate include chickpeas, kidney beans, and spinach. HOME CARE INSTRUCTIONS Keep all follow-up appointments as directed by your caregiver. You may need periodic exams to check for polyps. SEEK MEDICAL CARE IF: You notice bleeding during a bowel movement.   This information is not intended to replace advice given to you by your health care provider. Make sure you discuss any questions you have with your health care provider.   High-Fiber Diet Fiber, also called dietary fiber, is a type of carbohydrate found in fruits, vegetables, whole grains, and beans. A high-fiber diet can have many health benefits. Your health care provider may recommend a high-fiber diet to help:  Prevent constipation. Fiber can make your bowel movements more regular.  Lower your cholesterol.  Relieve hemorrhoids, uncomplicated diverticulosis, or irritable bowel syndrome.  Prevent overeating as part of a weight-loss plan.  Prevent heart disease, type 2 diabetes, and certain cancers. WHAT IS MY PLAN? The recommended daily intake of fiber includes:  38 grams for  men under age 14.  37 grams for men over age 15.  68 grams for women under age 38.  85 grams for women over age 26. You can get the recommended daily intake of dietary fiber by eating a variety of fruits, vegetables, grains, and beans. Your health care provider may also recommend a fiber supplement if it is not possible to get enough fiber through your diet. WHAT DO I NEED TO KNOW ABOUT A HIGH-FIBER DIET?  Fiber supplements have not been widely studied for their effectiveness, so it is better to get fiber through food sources.  Always check the fiber content on thenutrition facts label of any prepackaged food. Look for foods that contain at least 5 grams of fiber per serving.  Ask your dietitian if you have questions about specific foods that are related to your condition, especially if those foods are not listed in the following section.  Increase your daily fiber consumption gradually. Increasing your intake of dietary fiber too quickly may cause bloating, cramping, or gas.  Drink plenty of water. Water helps you to digest fiber. WHAT FOODS CAN I EAT? Grains Whole-grain breads. Multigrain cereal. Oats and oatmeal. Brown rice. Barley. Bulgur wheat.  [place]. Bran muffins. Popcorn. Rye wafer crackers. Vegetables Sweet potatoes. Spinach. Kale. Artichokes. Cabbage. Broccoli. Green peas. Carrots. Squash. Fruits Berries. Pears. Apples. Oranges. Avocados. Prunes and raisins. Dried figs. Meats and Other Protein Sources Navy, kidney, pinto, and soy beans. Split peas. Lentils. Nuts and seeds. Dairy Fiber-fortified yogurt. Beverages Fiber-fortified soy milk. Fiber-fortified orange juice. Other Fiber bars. The items listed above may not be a complete list of recommended foods or beverages. Contact your dietitian for more options. WHAT FOODS ARE NOT RECOMMENDED? Grains White bread. Pasta made with refined flour. White rice. Vegetables Fried potatoes. Canned vegetables. Well-cooked  vegetables.  Fruits Fruit juice. Cooked, strained fruit. Meats and Other Protein Sources Fatty cuts of meat. Fried Sales executive or fried fish. Dairy Milk. Yogurt. Cream cheese. Sour cream. Beverages Soft drinks. Other Cakes and pastries. Butter and oils. The items listed above may not be a complete list of foods and beverages to avoid. Contact your dietitian for more information. WHAT ARE SOME TIPS FOR INCLUDING HIGH-FIBER FOODS IN MY DIET?  Eat a wide variety of high-fiber foods.  Make sure that half of all grains consumed each day are whole grains.  Replace breads and cereals made from refined flour or white flour with whole-grain breads and cereals.  Replace white rice with brown rice, bulgur wheat, or millet.  Start the day with a breakfast that is high in fiber, such as a cereal that contains at least 5 grams of fiber per serving.  Use beans in place of meat in soups, salads, or pasta.  Eat high-fiber snacks, such as berries, raw vegetables, nuts, or popcorn.   This information is not intended to replace advice given to you by your health care provider. Make sure you discuss any questions you have with your health care provider.

## 2015-01-31 ENCOUNTER — Encounter: Payer: Self-pay | Admitting: Family Medicine

## 2015-01-31 ENCOUNTER — Encounter (HOSPITAL_COMMUNITY): Payer: Self-pay | Admitting: Internal Medicine

## 2015-01-31 DIAGNOSIS — D126 Benign neoplasm of colon, unspecified: Secondary | ICD-10-CM | POA: Insufficient documentation

## 2015-03-19 ENCOUNTER — Ambulatory Visit: Payer: BLUE CROSS/BLUE SHIELD | Admitting: Family Medicine

## 2015-03-24 LAB — HEMOGLOBIN A1C
Hgb A1c MFr Bld: 5.9 % — ABNORMAL HIGH (ref <5.7)
MEAN PLASMA GLUCOSE: 123 mg/dL — AB (ref <117)

## 2015-03-25 LAB — COMPLETE METABOLIC PANEL WITH GFR
ALBUMIN: 3.7 g/dL (ref 3.6–5.1)
ALT: 13 U/L (ref 6–29)
AST: 16 U/L (ref 10–35)
Alkaline Phosphatase: 99 U/L (ref 33–130)
BILIRUBIN TOTAL: 0.4 mg/dL (ref 0.2–1.2)
BUN: 36 mg/dL — ABNORMAL HIGH (ref 7–25)
CO2: 23 mmol/L (ref 20–31)
Calcium: 8.8 mg/dL (ref 8.6–10.4)
Chloride: 109 mmol/L (ref 98–110)
Creat: 1.79 mg/dL — ABNORMAL HIGH (ref 0.50–1.05)
GFR, EST AFRICAN AMERICAN: 35 mL/min — AB (ref >=60)
GFR, EST NON AFRICAN AMERICAN: 31 mL/min — AB (ref >=60)
GLUCOSE: 93 mg/dL (ref 65–99)
Potassium: 3.9 mmol/L (ref 3.5–5.3)
Sodium: 142 mmol/L (ref 135–146)
TOTAL PROTEIN: 6.5 g/dL (ref 6.1–8.1)

## 2015-03-31 ENCOUNTER — Encounter: Payer: Self-pay | Admitting: Family Medicine

## 2015-03-31 ENCOUNTER — Ambulatory Visit (INDEPENDENT_AMBULATORY_CARE_PROVIDER_SITE_OTHER): Payer: BLUE CROSS/BLUE SHIELD | Admitting: Family Medicine

## 2015-03-31 VITALS — BP 126/80 | HR 70 | Resp 18 | Ht 64.0 in | Wt 186.1 lb

## 2015-03-31 DIAGNOSIS — F17200 Nicotine dependence, unspecified, uncomplicated: Secondary | ICD-10-CM

## 2015-03-31 DIAGNOSIS — M62838 Other muscle spasm: Secondary | ICD-10-CM | POA: Diagnosis not present

## 2015-03-31 DIAGNOSIS — N183 Chronic kidney disease, stage 3 unspecified: Secondary | ICD-10-CM

## 2015-03-31 DIAGNOSIS — Z1159 Encounter for screening for other viral diseases: Secondary | ICD-10-CM

## 2015-03-31 DIAGNOSIS — E66811 Obesity, class 1: Secondary | ICD-10-CM

## 2015-03-31 DIAGNOSIS — Z114 Encounter for screening for human immunodeficiency virus [HIV]: Secondary | ICD-10-CM | POA: Diagnosis not present

## 2015-03-31 DIAGNOSIS — Z23 Encounter for immunization: Secondary | ICD-10-CM

## 2015-03-31 DIAGNOSIS — R7303 Prediabetes: Secondary | ICD-10-CM

## 2015-03-31 DIAGNOSIS — E559 Vitamin D deficiency, unspecified: Secondary | ICD-10-CM

## 2015-03-31 DIAGNOSIS — E669 Obesity, unspecified: Secondary | ICD-10-CM

## 2015-03-31 DIAGNOSIS — I10 Essential (primary) hypertension: Secondary | ICD-10-CM

## 2015-03-31 MED ORDER — CYCLOBENZAPRINE HCL 10 MG PO TABS: ORAL_TABLET | ORAL | Status: DC

## 2015-03-31 NOTE — Progress Notes (Signed)
Subjective:    Patient ID: Christina Williamson, female    DOB: April 02, 1955, 60 y.o.   MRN: [id number]  HPI   Christina Williamson     MRN: [id number]      DOB: [birth year]   HPI Christina Williamson is here for follow up and re-evaluation of chronic medical conditions, medication management and review of any available recent lab and radiology data.  Preventive health is updated, specifically  Cancer screening and Immunization.   Questions or concerns regarding consultations or procedures which the PT has had in the interim are  addressed. The PT denies any adverse reactions to current medications since the last visit.  There are no new concerns.  There are no specific complaints   ROS Denies recent fever or chills. Denies sinus pressure, nasal congestion, ear pain or sore throat. Denies chest congestion, productive cough or wheezing. Denies chest pains, palpitations and leg swelling Denies abdominal pain, nausea, vomiting,diarrhea or constipation.   Denies dysuria, frequency, hesitancy or incontinence. Denies joint pain, swelling and limitation in mobility. Denies headaches, seizures, numbness, or tingling. Denies skin break down or rash. Ongoing stress at home as spouse is alcoholic, she has learned to deal with this the best she is able   PE  BP 126/80 mmHg  Pulse 70  Resp 18  Ht 5\' 4"  (1.626 m)  Wt 186 lb 1.9 oz (84.423 kg)  BMI 31.93 kg/m2  SpO2 100%  Patient alert and oriented and in no cardiopulmonary distress.  HEENT: No facial asymmetry, EOMI,   oropharynx pink and moist.  Neck supple no JVD, no mass.  Chest: Clear to auscultation bilaterally.  CVS: S1, S2 no murmurs, no S3.Regular rate.  ABD: Soft non tender.   Ext: No edema  MS: Adequate ROM spine, shoulders, hips and knees.  Skin: Intact, no ulcerations or rash noted.  Psych: Good eye contact, normal affect. Memory intact not anxious, tearful at times, not depressed appearing.  CNS: CN 2-12 intact, power,  normal  throughout.no focal deficits noted.   Assessment & Plan   Essential hypertension Controlled, no change in medication DASH diet and commitment to daily physical activity for a minimum of 30 minutes discussed and encouraged, as a part of hypertension management. The importance of attaining a healthy weight is also discussed.  BP/Weight 03/31/2015 01/29/2015 10/15/2014 06/17/2014 02/11/2014 07/02/2013 123XX123  Systolic BP 123XX123 123XX123 123XX123 123456 [id number]  Diastolic BP 80 80 84 80 82 82 84  Wt. (Lbs) 186.12 187 181 180 183 181.04 195.8  BMI 31.93 33.13 31.05 30.88 31.67 31.33 33.88        Need for prophylactic vaccination and inoculation against influenza After obtaining informed consent, the vaccine is  administered by LPN.   Obesity (BMI 30.0-34.9) Deteriorated. Patient re-educated about  the importance of commitment to a  minimum of 150 minutes of exercise per week.  The importance of healthy food choices with portion control discussed. Encouraged to start a food diary, count calories and to consider  joining a support group. Sample diet sheets offered. Goals set by the patient for the next several months.   Weight /BMI 03/31/2015 01/29/2015 10/15/2014  WEIGHT 186 lb 1.9 oz 187 lb 181 lb  HEIGHT 5\' 4"  5\' 3"  5\' 4"   BMI 31.93 kg/m2 33.13 kg/m2 31.05 kg/m2    Current exercise per week 60 minutes.    Prediabetes Patient educated about the importance of limiting  Carbohydrate intake , the need to commit to daily physical activity  for a minimum of 30 minutes , and to commit weight loss. The fact that changes in all these areas will reduce or eliminate all together the development of diabetes is stressed.  Deteriorated Updated lab needed at/ before next visit.   Diabetic Labs Latest Ref Rng 03/24/2015 10/14/2014 06/11/2014 02/12/2013 10/17/2012  HbA1c <5.7 % 5.9(H) 5.8(H) 6.1(H) 5.8(H) -  Chol 0 - 200 mg/dL - - 173 - -  HDL >=46 mg/dL - - 71 - -  Calc LDL 0 - 99 mg/dL - - 88 - -    Triglycerides <150 mg/dL - - 71 - -  Creatinine 0.50 - 1.05 mg/dL 1.79(H) 1.75(H) 2.14(H) 1.79(H) 1.93(H)   BP/Weight 03/31/2015 01/29/2015 10/15/2014 06/17/2014 02/11/2014 07/02/2013 123XX123  Systolic BP 123XX123 123XX123 123XX123 123456 [id number]  Diastolic BP 80 80 84 80 82 82 84  Wt. (Lbs) 186.12 187 181 180 183 181.04 195.8  BMI 31.93 33.13 31.05 30.88 31.67 31.33 33.88   No flowsheet data found.     NICOTINE ADDICTION Patient counseled for approximately 5 minutes regarding the health risks of ongoing nicotine use, specifically all types of cancer, heart disease, stroke and respiratory failure. The options available for help with cessation ,the behavioral changes to assist the process, and the option to either gradully reduce usage  Or abruptly stop.is also discussed. Pt is also encouraged to set specific goals in number of cigarettes used daily, as well as to set a quit date.  Number of cigarettes/cigars currently smoking daily: 3   Muscle spasm of both lower legs Normal electrolytes, start flexeril and tylenol  At bedtime   CKD (chronic kidney disease) stage 3, GFR 30-59 ml/min Encouraged to discontinue smoking and keep blood pressure normal also increase vegetable intake      Review of Systems     Objective:   Physical Exam        Assessment & Plan:

## 2015-03-31 NOTE — Patient Instructions (Addendum)
F/u in 4.5 month, call if you need me before   New for muscle spasm and cramps is flexeril one at bedtime, and tylenol  Please work on good  health habits so that your health will improve. 1. Commitment to daily physical activity for 30 to 60  minutes, if you are able to do this.  2. Commitment to wise food choices. Aim for half of your  food intake to be vegetable and fruit, one quarter starchy foods, and one quarter protein. Try to eat on a regular schedule  3 meals per day, snacking between meals should be limited to vegetables or fruits or small portions of nuts. 64 ounces of water per day is generally recommended, unless you have specific health conditions, like heart failure or kidney failure where you will need to limit fluid intake.  3. Commitment to sufficient and a  good quality of physical and mental rest daily, generally between 6 to 8 hours per day.  WITH PERSISTANCE AND PERSEVERANCE, THE IMPOSSIBLE , BECOMES THE NORM!  Fasting lipid, vit D, HIV and Hep C  Flu vaccine today and please sched mammogram  You Can Quit Smoking If you are ready to quit smoking or are thinking about it, congratulations! You have chosen to help yourself be healthier and live longer! There are lots of different ways to quit smoking. Nicotine gum, nicotine patches, a nicotine inhaler, or nicotine nasal spray can help with physical craving. Hypnosis, support groups, and medicines help break the habit of smoking. TIPS TO GET OFF AND STAY OFF CIGARETTES  Learn to predict your moods. Do not let a bad situation be your excuse to have a cigarette. Some situations in your life might tempt you to have a cigarette.  Ask friends and co-workers not to smoke around you.  Make your home smoke-free.  Never have "just one" cigarette. It leads to wanting another and another. Remind yourself of your decision to quit.  On a card, make a list of your reasons for not smoking. Read it at least the same number of times  a day as you have a cigarette. Tell yourself everyday, "I do not want to smoke. I choose not to smoke."  Ask someone at home or work to help you with your plan to quit smoking.  Have something planned after you eat or have a cup of coffee. Take a walk or get other exercise to perk you up. This will help to keep you from overeating.  Try a relaxation exercise to calm you down and decrease your stress. Remember, you may be tense and nervous the first two weeks after you quit. This will pass.  Find new activities to keep your hands busy. Play with a pen, coin, or rubber band. Doodle or draw things on paper.  Brush your teeth right after eating. This will help cut down the craving for the taste of tobacco after meals. You can try mouthwash too.  Try gum, breath mints, or diet candy to keep something in your mouth. IF YOU SMOKE AND WANT TO QUIT:  Do not stock up on cigarettes. Never buy a carton. Wait until one pack is finished before you buy another.  Never carry cigarettes with you at work or at home.  Keep cigarettes as far away from you as possible. Leave them with someone else.  Never carry matches or a lighter with you.  Ask yourself, "Do I need this cigarette or is this just a reflex?"  Bet with someone that  you can quit. Put cigarette money in a piggy bank every morning. If you smoke, you give up the money. If you do not smoke, by the end of the week, you keep the money.  Keep trying. It takes 21 days to change a habit!  Talk to your doctor about using medicines to help you quit. These include nicotine replacement gum, lozenges, or skin patches.   This information is not intended to replace advice given to you by your health care provider. Make sure you discuss any questions you have with your health care provider.   Document Released: 12/12/2008 Document Revised: 05/10/2011 Document Reviewed: 12/12/2008 Elsevier Interactive Patient Education [employer].

## 2015-04-13 NOTE — Assessment & Plan Note (Signed)
Encouraged to discontinue smoking and keep blood pressure normal also increase vegetable intake

## 2015-04-13 NOTE — Assessment & Plan Note (Signed)
Deteriorated. Patient re-educated about  the importance of commitment to a  minimum of 150 minutes of exercise per week.  The importance of healthy food choices with portion control discussed. Encouraged to start a food diary, count calories and to consider  joining a support group. Sample diet sheets offered. Goals set by the patient for the next several months.   Weight /BMI 03/31/2015 01/29/2015 10/15/2014  WEIGHT 186 lb 1.9 oz 187 lb 181 lb  HEIGHT 5\' 4"  5\' 3"  5\' 4"   BMI 31.93 kg/m2 33.13 kg/m2 31.05 kg/m2    Current exercise per week 60 minutes.

## 2015-04-13 NOTE — Assessment & Plan Note (Signed)

## 2015-04-13 NOTE — Assessment & Plan Note (Signed)
After obtaining informed consent, the vaccine is  administered by LPN.  

## 2015-04-13 NOTE — Assessment & Plan Note (Signed)
Controlled, no change in medication DASH diet and commitment to daily physical activity for a minimum of 30 minutes discussed and encouraged, as a part of hypertension management. The importance of attaining a healthy weight is also discussed.  BP/Weight 03/31/2015 01/29/2015 10/15/2014 06/17/2014 02/11/2014 07/02/2013 123XX123  Systolic BP 123XX123 123XX123 123XX123 123456 123456 [id number]  Diastolic BP 80 80 84 80 82 82 84  Wt. (Lbs) 186.12 187 181 180 183 181.04 195.8  BMI 31.93 33.13 31.05 30.88 31.67 31.33 33.88

## 2015-04-13 NOTE — Assessment & Plan Note (Addendum)
Patient educated about the importance of limiting  Carbohydrate intake , the need to commit to daily physical activity for a minimum of 30 minutes , and to commit weight loss. The fact that changes in all these areas will reduce or eliminate all together the development of diabetes is stressed.  Deteriorated Updated lab needed at/ before next visit.   Diabetic Labs Latest Ref Rng 03/24/2015 10/14/2014 06/11/2014 02/12/2013 10/17/2012  HbA1c <5.7 % 5.9(H) 5.8(H) 6.1(H) 5.8(H) -  Chol 0 - 200 mg/dL - - 173 - -  HDL >=46 mg/dL - - 71 - -  Calc LDL 0 - 99 mg/dL - - 88 - -  Triglycerides <150 mg/dL - - 71 - -  Creatinine 0.50 - 1.05 mg/dL 1.79(H) 1.75(H) 2.14(H) 1.79(H) 1.93(H)   BP/Weight 03/31/2015 01/29/2015 10/15/2014 06/17/2014 02/11/2014 07/02/2013 123XX123  Systolic BP 123XX123 123XX123 123XX123 123456 123456 [id number]  Diastolic BP 80 80 84 80 82 82 84  Wt. (Lbs) 186.12 187 181 180 183 181.04 195.8  BMI 31.93 33.13 31.05 30.88 31.67 31.33 33.88   No flowsheet data found.

## 2015-04-13 NOTE — Assessment & Plan Note (Signed)
Normal electrolytes, start flexeril and tylenol  At bedtime

## 2015-05-03 ENCOUNTER — Other Ambulatory Visit: Payer: Self-pay | Admitting: Family Medicine

## 2015-06-20 ENCOUNTER — Other Ambulatory Visit: Payer: Self-pay | Admitting: Family Medicine

## 2015-08-11 ENCOUNTER — Ambulatory Visit (INDEPENDENT_AMBULATORY_CARE_PROVIDER_SITE_OTHER): Payer: BLUE CROSS/BLUE SHIELD | Admitting: Family Medicine

## 2015-08-11 ENCOUNTER — Encounter: Payer: Self-pay | Admitting: Family Medicine

## 2015-08-11 VITALS — BP 120/82 | HR 89 | Resp 16 | Ht 64.0 in | Wt 184.0 lb

## 2015-08-11 DIAGNOSIS — I10 Essential (primary) hypertension: Secondary | ICD-10-CM

## 2015-08-11 DIAGNOSIS — E669 Obesity, unspecified: Secondary | ICD-10-CM

## 2015-08-11 DIAGNOSIS — G479 Sleep disorder, unspecified: Secondary | ICD-10-CM | POA: Diagnosis not present

## 2015-08-11 DIAGNOSIS — R7303 Prediabetes: Secondary | ICD-10-CM

## 2015-08-11 DIAGNOSIS — Z13220 Encounter for screening for lipoid disorders: Secondary | ICD-10-CM

## 2015-08-11 DIAGNOSIS — F17200 Nicotine dependence, unspecified, uncomplicated: Secondary | ICD-10-CM

## 2015-08-11 DIAGNOSIS — Z1159 Encounter for screening for other viral diseases: Secondary | ICD-10-CM

## 2015-08-11 DIAGNOSIS — R5382 Chronic fatigue, unspecified: Secondary | ICD-10-CM | POA: Diagnosis not present

## 2015-08-11 DIAGNOSIS — E559 Vitamin D deficiency, unspecified: Secondary | ICD-10-CM

## 2015-08-11 DIAGNOSIS — M546 Pain in thoracic spine: Secondary | ICD-10-CM

## 2015-08-11 MED ORDER — PHENTERMINE HCL 37.5 MG PO TABS: 37.5000 mg | ORAL_TABLET | Freq: Every day | ORAL | Status: DC

## 2015-08-11 NOTE — Assessment & Plan Note (Signed)

## 2015-08-11 NOTE — Progress Notes (Signed)
Subjective:    Patient ID: Christina Williamson, female    DOB: 05/06/1955, 60 y.o.   MRN: [id number]  HPI   Christina Williamson     MRN: [id number]      DOB: [birth year]   HPI Ms. Plourde is here for follow up and re-evaluation of chronic medical conditions, medication management and review of any available recent lab and radiology data.  Preventive health is updated, specifically  Cancer screening and Immunization.   Questions or concerns regarding consultations or procedures which the PT has had in the interim are  addressed. The PT denies any adverse reactions to current medications since the last visit.  There are no new concerns.  There are no specific complaints   ROS Denies recent fever or chills. Denies sinus pressure, nasal congestion, ear pain or sore throat. Denies chest congestion, productive cough or wheezing. Denies chest pains, palpitations and leg swelling Denies abdominal pain, nausea, vomiting,diarrhea or constipation.   Denies dysuria, frequency, hesitancy or incontinence. Denies joint pain, swelling and limitation in mobility. Denies headaches, seizures, numbness, or tingling. Denies depression, anxiety or insomnia. Denies skin break down or rash.   PE  BP 120/82 mmHg  Pulse 89  Resp 16  Ht 5\' 4"  (1.626 m)  Wt 184 lb (83.462 kg)  BMI 31.57 kg/m2  SpO2 99%  Patient alert and oriented and in no cardiopulmonary distress.  HEENT: No facial asymmetry, EOMI,   oropharynx pink and moist.  Neck supple no JVD, no mass.  Chest: Clear to auscultation bilaterally.  CVS: S1, S2 no murmurs, no S3.Regular rate.  ABD: Soft non tender.   Ext: No edema  MS: Adequate ROM spine, shoulders, hips and knees.  Skin: Intact, no ulcerations or rash noted.  Psych: Good eye contact, normal affect. Memory intact not anxious or depressed appearing.  CNS: CN 2-12 intact, power,  normal throughout.no focal deficits noted.   Assessment & Plan   Essential  hypertension Controlled, no change in medication DASH diet and commitment to daily physical activity for a minimum of 30 minutes discussed and encouraged, as a part of hypertension management. The importance of attaining a healthy weight is also discussed.  BP/Weight 08/11/2015 03/31/2015 01/29/2015 10/15/2014 06/17/2014 [id number]  Systolic BP 123456 123XX123 123XX123 123XX123 123456 123456 [id number]  Diastolic BP 82 80 80 84 80 82 82  Wt. (Lbs) 184 186.12 187 181 180 183 181.04  BMI 31.57 31.93 33.13 31.05 30.88 31.67 31.33        NICOTINE ADDICTION Patient counseled for approximately 5 minutes regarding the health risks of ongoing nicotine use, specifically all types of cancer, heart disease, stroke and respiratory failure. The options available for help with cessation ,the behavioral changes to assist the process, and the option to either gradully reduce usage  Or abruptly stop.is also discussed. Pt is also encouraged to set specific goals in number of cigarettes used daily, as well as to set a quit date.  Number of cigarettes/cigars currently smoking daily: 10   Obesity (BMI 30.0-34.9) Deteriorated. Patient re-educated about  the importance of commitment to a  minimum of 150 minutes of exercise per week.  The importance of healthy food choices with portion control discussed. Encouraged to start a food diary, count calories and to consider  joining a support group. Sample diet sheets offered. Goals set by the patient for the next several months. Start half phentermine daily   Weight /BMI 08/11/2015 03/31/2015 01/29/2015  WEIGHT 184 lb 186 lb 1.9 oz  187 lb  HEIGHT 5\' 4"  5\' 4"  5\' 3"   BMI 31.57 kg/m2 31.93 kg/m2 33.13 kg/m2    Current exercise per week 90 minutes.   Prediabetes Patient educated about the importance of limiting  Carbohydrate intake , the need to commit to daily physical activity for a minimum of 30 minutes , and to commit weight loss. The fact that changes in all these areas will  reduce or eliminate all together the development of diabetes is stressed.  Improved  Diabetic Labs Latest Ref Rng 03/24/2015 10/14/2014 06/11/2014 02/12/2013 10/17/2012  HbA1c <5.7 % 5.9(H) 5.8(H) 6.1(H) 5.8(H) -  Chol 0 - 200 mg/dL - - 173 - -  HDL >=46 mg/dL - - 71 - -  Calc LDL 0 - 99 mg/dL - - 88 - -  Triglycerides <150 mg/dL - - 71 - -  Creatinine 0.50 - 1.05 mg/dL 1.79(H) 1.75(H) 2.14(H) 1.79(H) 1.93(H)   BP/Weight 08/11/2015 03/31/2015 01/29/2015 10/15/2014 06/17/2014 [id number]  Systolic BP 123456 123XX123 123XX123 123XX123 123456 123456 [id number]  Diastolic BP 82 80 80 84 80 82 82  Wt. (Lbs) 184 186.12 187 181 180 183 181.04  BMI 31.57 31.93 33.13 31.05 30.88 31.67 31.33   No flowsheet data found.     Sleep disorder Chronic fatigue , headaches, and excessive daytime sleepiness      Review of Systems     Objective:   Physical Exam        Assessment & Plan:

## 2015-08-11 NOTE — Assessment & Plan Note (Signed)
Chronic fatigue , headaches, and excessive daytime sleepiness

## 2015-08-11 NOTE — Assessment & Plan Note (Signed)
Patient educated about the importance of limiting  Carbohydrate intake , the need to commit to daily physical activity for a minimum of 30 minutes , and to commit weight loss. The fact that changes in all these areas will reduce or eliminate all together the development of diabetes is stressed.  Improved  Diabetic Labs Latest Ref Rng 03/24/2015 10/14/2014 06/11/2014 02/12/2013 10/17/2012  HbA1c <5.7 % 5.9(H) 5.8(H) 6.1(H) 5.8(H) -  Chol 0 - 200 mg/dL - - 173 - -  HDL >=46 mg/dL - - 71 - -  Calc LDL 0 - 99 mg/dL - - 88 - -  Triglycerides <150 mg/dL - - 71 - -  Creatinine 0.50 - 1.05 mg/dL 1.79(H) 1.75(H) 2.14(H) 1.79(H) 1.93(H)   BP/Weight 08/11/2015 03/31/2015 01/29/2015 10/15/2014 06/17/2014 [id number]  Systolic BP 123456 123XX123 123XX123 123XX123 123456 123456 [id number]  Diastolic BP 82 80 80 84 80 82 82  Wt. (Lbs) 184 186.12 187 181 180 183 181.04  BMI 31.57 31.93 33.13 31.05 30.88 31.67 31.33   No flowsheet data found.

## 2015-08-11 NOTE — Assessment & Plan Note (Addendum)
Deteriorated. Patient re-educated about  the importance of commitment to a  minimum of 150 minutes of exercise per week.  The importance of healthy food choices with portion control discussed. Encouraged to start a food diary, count calories and to consider  joining a support group. Sample diet sheets offered. Goals set by the patient for the next several months. Start half phentermine daily   Weight /BMI 08/11/2015 03/31/2015 01/29/2015  WEIGHT 184 lb 186 lb 1.9 oz 187 lb  HEIGHT 5\' 4"  5\' 4"  5\' 3"   BMI 31.57 kg/m2 31.93 kg/m2 33.13 kg/m2    Current exercise per week 90 minutes.

## 2015-08-11 NOTE — Patient Instructions (Addendum)
Annual physical exam in 4 months, call if you need me before  Please cut back on cigarettes, start 8 cigarettes daily, for next 1 week, then down to 7 cigarettes daily next week , then continue to reduce cigarettes, 1800 QUIT NOW  Fasting CBC, lipid, cmp and EGFR, TSH, HBA1C, vit D, Hep C and HIV next week Monday  NEED to schedule mammogram, past due  Half phentermine daily, goal of 6 pound weight loss  Eat between 7am and7 pm , set meal schedule 3 times daily, snack on vegetable or fruit  Commit to 64 ounces water daily, no sweet drinks  It is important that you exercise regularly at least 30 minutes 5 times a week. If you develop chest pain, have severe difficulty breathing, or feel very tired, stop exercising immediately and seek medical attention   You are referred for evaluiation for sleep apnea, Dr Merlene Laughter 's office will contact you  Thank you  for choosing [facility]. We consider it a privelige to serve you.  Delivering excellent health care in a caring and  compassionate way is our goal.  Partnering with you,  so that together we can achieve this goal is our strategy.

## 2015-08-11 NOTE — Assessment & Plan Note (Signed)
Controlled, no change in medication DASH diet and commitment to daily physical activity for a minimum of 30 minutes discussed and encouraged, as a part of hypertension management. The importance of attaining a healthy weight is also discussed.  BP/Weight 08/11/2015 03/31/2015 01/29/2015 10/15/2014 06/17/2014 [id number]  Systolic BP 123456 123XX123 123XX123 123XX123 123456 123456 [id number]  Diastolic BP 82 80 80 84 80 82 82  Wt. (Lbs) 184 186.12 187 181 180 183 181.04  BMI 31.57 31.93 33.13 31.05 30.88 31.67 31.33

## 2015-08-13 ENCOUNTER — Encounter: Payer: Self-pay | Admitting: Family Medicine

## 2015-10-13 ENCOUNTER — Other Ambulatory Visit: Payer: Self-pay | Admitting: Family Medicine

## 2015-11-29 ENCOUNTER — Other Ambulatory Visit: Payer: Self-pay | Admitting: Family Medicine

## 2015-11-29 DIAGNOSIS — E669 Obesity, unspecified: Secondary | ICD-10-CM

## 2015-12-08 ENCOUNTER — Other Ambulatory Visit (HOSPITAL_COMMUNITY)
Admission: RE | Admit: 2015-12-08 | Discharge: 2015-12-08 | Disposition: A | Discharge status: Home / Self Care | Payer: BLUE CROSS/BLUE SHIELD | Source: Ambulatory Visit | Attending: Family Medicine | Admitting: Family Medicine

## 2015-12-08 ENCOUNTER — Ambulatory Visit (HOSPITAL_COMMUNITY)
Admission: RE | Admit: 2015-12-08 | Discharge: 2015-12-08 | Disposition: A | Discharge status: Home / Self Care | Payer: BLUE CROSS/BLUE SHIELD | Source: Ambulatory Visit | Attending: Family Medicine | Admitting: Family Medicine

## 2015-12-08 DIAGNOSIS — Z1231 Encounter for screening mammogram for malignant neoplasm of breast: Secondary | ICD-10-CM | POA: Diagnosis not present

## 2015-12-08 DIAGNOSIS — R7303 Prediabetes: Secondary | ICD-10-CM | POA: Diagnosis present

## 2015-12-08 DIAGNOSIS — Z13220 Encounter for screening for lipoid disorders: Secondary | ICD-10-CM | POA: Insufficient documentation

## 2015-12-08 DIAGNOSIS — Z1159 Encounter for screening for other viral diseases: Secondary | ICD-10-CM | POA: Diagnosis present

## 2015-12-08 DIAGNOSIS — E559 Vitamin D deficiency, unspecified: Secondary | ICD-10-CM | POA: Diagnosis present

## 2015-12-08 DIAGNOSIS — I10 Essential (primary) hypertension: Secondary | ICD-10-CM | POA: Diagnosis present

## 2015-12-08 LAB — LIPID PANEL
CHOLESTEROL: 229 mg/dL — AB (ref 0–200)
HDL: 64 mg/dL (ref >40)
LDL CALC: 137 mg/dL — AB (ref 0–99)
TRIGLYCERIDES: 142 mg/dL (ref <150)
Total CHOL/HDL Ratio: 3.6 RATIO
VLDL: 28 mg/dL (ref 0–40)

## 2015-12-08 LAB — CBC
HEMATOCRIT: 36.4 % (ref 36.0–46.0)
HEMOGLOBIN: 12 g/dL (ref 12.0–15.0)
MCH: 31.3 pg (ref 26.0–34.0)
MCHC: 33 g/dL (ref 30.0–36.0)
MCV: 95 fL (ref 78.0–100.0)
Platelets: 259 10*3/uL (ref 150–400)
RBC: 3.83 MIL/uL — ABNORMAL LOW (ref 3.87–5.11)
RDW: 13.8 % (ref 11.5–15.5)
WBC: 5.6 10*3/uL (ref 4.0–10.5)

## 2015-12-08 LAB — TSH: TSH: 1.709 u[IU]/mL (ref 0.350–4.500)

## 2015-12-08 LAB — COMPREHENSIVE METABOLIC PANEL
ALK PHOS: 104 U/L (ref 38–126)
ALT: 22 U/L (ref 14–54)
AST: 23 U/L (ref 15–41)
Albumin: 3.9 g/dL (ref 3.5–5.0)
Anion gap: 6 (ref 5–15)
BILIRUBIN TOTAL: 0.7 mg/dL (ref 0.3–1.2)
BUN: 42 mg/dL — AB (ref 6–20)
CHLORIDE: 110 mmol/L (ref 101–111)
CO2: 21 mmol/L — AB (ref 22–32)
CREATININE: 1.92 mg/dL — AB (ref 0.44–1.00)
Calcium: 9.1 mg/dL (ref 8.9–10.3)
GFR, EST AFRICAN AMERICAN: 32 mL/min — AB (ref >60)
GFR, EST NON AFRICAN AMERICAN: 28 mL/min — AB (ref >60)
GLUCOSE: 105 mg/dL — AB (ref 65–99)
POTASSIUM: 4.1 mmol/L (ref 3.5–5.1)
Sodium: 137 mmol/L (ref 135–145)
Total Protein: 7.6 g/dL (ref 6.5–8.1)

## 2015-12-09 LAB — HEMOGLOBIN A1C
Hgb A1c MFr Bld: 5.8 % — ABNORMAL HIGH (ref 4.8–5.6)
MEAN PLASMA GLUCOSE: 120 mg/dL

## 2015-12-09 LAB — HEPATITIS C ANTIBODY

## 2015-12-09 LAB — VITAMIN D 25 HYDROXY (VIT D DEFICIENCY, FRACTURES): Vit D, 25-Hydroxy: 17.5 ng/mL — ABNORMAL LOW (ref 30.0–100.0)

## 2015-12-09 LAB — HIV ANTIBODY (ROUTINE TESTING W REFLEX): HIV SCREEN 4TH GENERATION: NONREACTIVE

## 2016-01-19 ENCOUNTER — Encounter: Payer: BLUE CROSS/BLUE SHIELD | Admitting: Family Medicine

## 2016-01-26 ENCOUNTER — Encounter: Payer: Self-pay | Admitting: Family Medicine

## 2016-01-26 ENCOUNTER — Ambulatory Visit (INDEPENDENT_AMBULATORY_CARE_PROVIDER_SITE_OTHER): Payer: BLUE CROSS/BLUE SHIELD | Admitting: Family Medicine

## 2016-01-26 VITALS — BP 120/84 | HR 82 | Resp 16 | Ht 64.0 in | Wt 192.0 lb

## 2016-01-26 DIAGNOSIS — R7303 Prediabetes: Secondary | ICD-10-CM | POA: Diagnosis not present

## 2016-01-26 DIAGNOSIS — Z23 Encounter for immunization: Secondary | ICD-10-CM | POA: Diagnosis not present

## 2016-01-26 DIAGNOSIS — Z1211 Encounter for screening for malignant neoplasm of colon: Secondary | ICD-10-CM | POA: Diagnosis not present

## 2016-01-26 DIAGNOSIS — Z0000 Encounter for general adult medical examination without abnormal findings: Secondary | ICD-10-CM | POA: Diagnosis not present

## 2016-01-26 DIAGNOSIS — Z124 Encounter for screening for malignant neoplasm of cervix: Secondary | ICD-10-CM

## 2016-01-26 DIAGNOSIS — I10 Essential (primary) hypertension: Secondary | ICD-10-CM | POA: Diagnosis not present

## 2016-01-26 DIAGNOSIS — F17200 Nicotine dependence, unspecified, uncomplicated: Secondary | ICD-10-CM

## 2016-01-26 DIAGNOSIS — E669 Obesity, unspecified: Secondary | ICD-10-CM | POA: Diagnosis not present

## 2016-01-26 LAB — POC HEMOCCULT BLD/STL (OFFICE/1-CARD/DIAGNOSTIC): FECAL OCCULT BLD: NEGATIVE

## 2016-01-26 MED ORDER — ERGOCALCIFEROL 1.25 MG (50000 UT) PO CAPS: 50000.0000 [IU] | ORAL_CAPSULE | ORAL | 1 refills | Status: DC

## 2016-01-26 NOTE — Patient Instructions (Addendum)
F/U in 4.5 month, call if you need me before  Flu vaccine today  Reduce to 8 ciggs per day, starting this week, cut back by ione cigarette each week  Vit D is sent take one weekly  Ensure 64 ounces water daily  80 % diet is plants, fresh / frozen, little to no additives It is important that you exercise regularly at least 30 minutes 5 times a week. If you develop chest pain, have severe difficulty breathing, or feel very tired, stop exercising immediately and seek medical attention    Fasting lipid, cmp and eGFr, hBA1C in 4.5 months   Steps to Quit Smoking Smoking tobacco can be harmful to your health and can affect almost every organ in your body. Smoking puts you, and those around you, at risk for developing many serious chronic diseases. Quitting smoking is difficult, but it is one of the best things that you can do for your health. It is never too late to quit. What are the benefits of quitting smoking? When you quit smoking, you lower your risk of developing serious diseases and conditions, such as:  Lung cancer or lung disease, such as COPD.  Heart disease.  Stroke.  Heart attack.  Infertility.  Osteoporosis and bone fractures. Additionally, symptoms such as coughing, wheezing, and shortness of breath may get better when you quit. You may also find that you get sick less often because your body is stronger at fighting off colds and infections. If you are pregnant, quitting smoking can help to reduce your chances of having a baby of low birth weight. How do I get ready to quit? When you decide to quit smoking, create a plan to make sure that you are successful. Before you quit:  Pick a date to quit. Set a date within the next two weeks to give you time to prepare.  Write down the reasons why you are quitting. Keep this list in places where you will see it often, such as on your bathroom mirror or in your car or wallet.  Identify the people, places, things, and activities  that make you want to smoke (triggers) and avoid them. Make sure to take these actions:  Throw away all cigarettes at home, at work, and in your car.  Throw away smoking accessories, such as Scientist, research (medical).  Clean your car and make sure to empty the ashtray.  Clean your home, including curtains and carpets.  Tell your family, friends, and coworkers that you are quitting. Support from your loved ones can make quitting easier.  Talk with your health care provider about your options for quitting smoking.  Find out what treatment options are covered by your health insurance. What strategies can I use to quit smoking? Talk with your healthcare provider about different strategies to quit smoking. Some strategies include:  Quitting smoking altogether instead of gradually lessening how much you smoke over a period of time. Research shows that quitting "cold [place]" is more successful than gradually quitting.  Attending in-person counseling to help you build problem-solving skills. You are more likely to have success in quitting if you attend several counseling sessions. Even short sessions of 10 minutes can be effective.  Finding resources and support systems that can help you to quit smoking and remain smoke-free after you quit. These resources are most helpful when you use them often. They can include:  Online chats with a Social worker.  Telephone quitlines.  Printed Furniture conservator/restorer.  Support groups or group counseling.  Text messaging programs.  Mobile phone applications.  Taking medicines to help you quit smoking. (If you are pregnant or breastfeeding, talk with your health care provider first.) Some medicines contain nicotine and some do not. Both types of medicines help with cravings, but the medicines that include nicotine help to relieve withdrawal symptoms. Your health care provider may recommend:  Nicotine patches, gum, or lozenges.  Nicotine inhalers or  sprays.  Non-nicotine medicine that is taken by mouth. Talk with your health care provider about combining strategies, such as taking medicines while you are also receiving in-person counseling. Using these two strategies together makes you more likely to succeed in quitting than if you used either strategy on its own. If you are pregnant or breastfeeding, talk with your health care provider about finding counseling or other support strategies to quit smoking. Do not take medicine to help you quit smoking unless told to do so by your health care provider. What things can I do to make it easier to quit? Quitting smoking might feel overwhelming at first, but there is a lot that you can do to make it easier. Take these important actions:  Reach out to your family and friends and ask that they support and encourage you during this time. Call telephone quitlines, reach out to support groups, or work with a counselor for support.  Ask people who smoke to avoid smoking around you.  Avoid places that trigger you to smoke, such as bars, parties, or smoke-break areas at work.  Spend time around people who do not smoke.  Lessen stress in your life, because stress can be a smoking trigger for some people. To lessen stress, try:  Exercising regularly.  Deep-breathing exercises.  Yoga.  Meditating.  Performing a body scan. This involves closing your eyes, scanning your body from head to toe, and noticing which parts of your body are particularly tense. Purposefully relax the muscles in those areas.  Download or purchase mobile phone or tablet apps (applications) that can help you stick to your quit plan by providing reminders, tips, and encouragement. There are many free apps, such as QuitGuide from the [employer] Office manager for Disease Control and Prevention). You can find other support for quitting smoking (smoking cessation) through smokefree.gov and other websites. How will I feel when I quit  smoking? Within the first 24 hours of quitting smoking, you may start to feel some withdrawal symptoms. These symptoms are usually most noticeable 2-3 days after quitting, but they usually do not last beyond 2-3 weeks. Changes or symptoms that you might experience include:  Mood swings.  Restlessness, anxiety, or irritation.  Difficulty concentrating.  Dizziness.  Strong cravings for sugary foods in addition to nicotine.  Mild weight gain.  Constipation.  Nausea.  Coughing or a sore throat.  Changes in how your medicines work in your body.  A depressed mood.  Difficulty sleeping (insomnia). After the first 2-3 weeks of quitting, you may start to notice more positive results, such as:  Improved sense of smell and taste.  Decreased coughing and sore throat.  Slower heart rate.  Lower blood pressure.  Clearer skin.  The ability to breathe more easily.  Fewer sick days. Quitting smoking is very challenging for most people. Do not get discouraged if you are not successful the first time. Some people need to make many attempts to quit before they achieve long-term success. Do your best to stick to your quit plan, and talk with your health care provider if you  have any questions or concerns. This information is not intended to replace advice given to you by your health care provider. Make sure you discuss any questions you have with your health care provider. Document Released: 02/09/2001 Document Revised: 10/14/2015 Document Reviewed: 07/02/2014 Elsevier Interactive Patient Education  2017 [employer].

## 2016-01-27 ENCOUNTER — Encounter: Payer: Self-pay | Admitting: Family Medicine

## 2016-01-27 NOTE — Progress Notes (Signed)
    REEMA CHICK     MRN: [id number]      DOB: December 03, 1955  HPI: Patient is in for annual physical exam. No other health concerns are expressed or addressed at the visit. Recent labs, if available are reviewed. Immunization is reviewed , and  updated if needed.   PE: Pleasant  female, alert and oriented x 3, in no cardio-pulmonary distress. Afebrile. HEENT No facial trauma or asymetry. Sinuses non tender.  Extra occullar muscles intact, pupils equally reactive to light. External ears normal, tympanic membranes clear. Oropharynx moist, no exudate. Neck: supple, no adenopathy,JVD or thyromegaly.No bruits.  Chest: Clear to ascultation bilaterally.No crackles or wheezes. Non tender to palpation  Breast: No asymetry,no masses or lumps. No tenderness. No nipple discharge or inversion. No axillary or supraclavicular adenopathy  Cardiovascular system; Heart sounds normal,  S1 and  S2 ,no S3.  No murmur, or thrill. Apical beat not displaced Peripheral pulses normal.  Abdomen: Soft, non tender, no organomegaly or masses. No bruits. Bowel sounds normal. No guarding, tenderness or rebound.  Rectal:  Normal sphincter tone. No rectal mass. Guaiac negative stool.  GU: External genitalia normal female genitalia , normal female distribution of hair. No lesions. Urethral meatus normal in size, no  Prolapse, no lesions visibly  Present. Bladder non tender. Vagina pink and moist , with no visible lesions , discharge present . Adequate pelvic support no  cystocele or rectocele noted Uterus absent, no adnexal masses, no adnexal tenderness.   Musculoskeletal exam: Full ROM of spine, hips , shoulders and knees. No deformity ,swelling or crepitus noted. No muscle wasting or atrophy.   Neurologic: Cranial nerves 2 to 12 intact. Power, tone ,sensation and reflexes normal throughout. No disturbance in gait. No tremor.  Skin: Intact, no ulceration, erythema , scaling or rash  noted. Pigmentation normal throughout  Psych; Normal mood and affect. Judgement and concentration normal   Assessment & Plan:  Annual physical exam Annual exam as documented. Counseling done  re healthy lifestyle involving commitment to 150 minutes exercise per week, heart healthy diet, and attaining healthy weight.The importance of adequate sleep also discussed. Regular seat belt use and home safety, is also discussed. Changes in health habits are decided on by the patient with goals and time frames  set for achieving them. Immunization and cancer screening needs are specifically addressed at this visit.   NICOTINE ADDICTION Patient counseled for approximately 5 minutes regarding the health risks of ongoing nicotine use, specifically all types of cancer, heart disease, stroke and respiratory failure. The options available for help with cessation ,the behavioral changes to assist the process, and the option to either gradully reduce usage  Or abruptly stop.is also discussed. Pt is also encouraged to set specific goals in number of cigarettes used daily, as well as to set a quit date.

## 2016-01-27 NOTE — Assessment & Plan Note (Signed)

## 2016-01-27 NOTE — Assessment & Plan Note (Signed)

## 2016-03-09 ENCOUNTER — Other Ambulatory Visit: Payer: Self-pay | Admitting: Family Medicine

## 2016-06-07 LAB — COMPLETE METABOLIC PANEL WITH GFR
ALBUMIN: 3.9 g/dL (ref 3.6–5.1)
ALT: 14 U/L (ref 6–29)
AST: 20 U/L (ref 10–35)
Alkaline Phosphatase: 96 U/L (ref 33–130)
BILIRUBIN TOTAL: 0.5 mg/dL (ref 0.2–1.2)
BUN: 48 mg/dL — AB (ref 7–25)
CO2: 27 mmol/L (ref 20–31)
CREATININE: 2.24 mg/dL — AB (ref 0.50–1.05)
Calcium: 9.1 mg/dL (ref 8.6–10.4)
Chloride: 107 mmol/L (ref 98–110)
GFR, EST AFRICAN AMERICAN: 27 mL/min — AB (ref >=60)
GFR, EST NON AFRICAN AMERICAN: 23 mL/min — AB (ref >=60)
GLUCOSE: 95 mg/dL (ref 65–99)
Potassium: 4.4 mmol/L (ref 3.5–5.3)
Sodium: 141 mmol/L (ref 135–146)
TOTAL PROTEIN: 7 g/dL (ref 6.1–8.1)

## 2016-06-07 LAB — LIPID PANEL
CHOLESTEROL: 195 mg/dL (ref <200)
HDL: 67 mg/dL (ref >50)
LDL Cholesterol: 106 mg/dL — ABNORMAL HIGH (ref <100)
TRIGLYCERIDES: 109 mg/dL (ref <150)
Total CHOL/HDL Ratio: 2.9 Ratio (ref <5.0)
VLDL: 22 mg/dL (ref <30)

## 2016-06-07 LAB — HEMOGLOBIN A1C
Hgb A1c MFr Bld: 5.6 % (ref <5.7)
MEAN PLASMA GLUCOSE: 114 mg/dL

## 2016-06-08 ENCOUNTER — Ambulatory Visit (INDEPENDENT_AMBULATORY_CARE_PROVIDER_SITE_OTHER): Payer: BLUE CROSS/BLUE SHIELD | Admitting: Family Medicine

## 2016-06-08 ENCOUNTER — Encounter: Payer: Self-pay | Admitting: Family Medicine

## 2016-06-08 VITALS — BP 118/84 | HR 75 | Resp 16 | Ht 64.0 in | Wt 190.0 lb

## 2016-06-08 DIAGNOSIS — F17200 Nicotine dependence, unspecified, uncomplicated: Secondary | ICD-10-CM | POA: Diagnosis not present

## 2016-06-08 DIAGNOSIS — I10 Essential (primary) hypertension: Secondary | ICD-10-CM | POA: Diagnosis not present

## 2016-06-08 DIAGNOSIS — E8881 Metabolic syndrome: Secondary | ICD-10-CM | POA: Insufficient documentation

## 2016-06-08 DIAGNOSIS — R7303 Prediabetes: Secondary | ICD-10-CM

## 2016-06-08 DIAGNOSIS — E669 Obesity, unspecified: Secondary | ICD-10-CM | POA: Diagnosis not present

## 2016-06-08 DIAGNOSIS — E785 Hyperlipidemia, unspecified: Secondary | ICD-10-CM | POA: Diagnosis not present

## 2016-06-08 NOTE — Assessment & Plan Note (Signed)
Improved. Pt applauded on succesful weight loss through lifestyle change, and encouraged to continue same. Weight loss goal set for the next several months.  

## 2016-06-08 NOTE — Assessment & Plan Note (Signed)
Improved, congratulated on this Hyperlipidemia:Low fat diet discussed and encouraged.   Lipid Panel  Lab Results  Component Value Date   CHOL 195 06/07/2016   HDL 67 06/07/2016   LDLCALC 106 (H) 06/07/2016   TRIG 109 06/07/2016   CHOLHDL 2.9 06/07/2016   Continue healthy diet

## 2016-06-08 NOTE — Assessment & Plan Note (Signed)
Controlled, no change in medication DASH diet and commitment to daily physical activity for a minimum of 30 minutes discussed and encouraged, as a part of hypertension management. The importance of attaining a healthy weight is also discussed.  BP/Weight 06/08/2016 01/26/2016 08/11/2015 03/31/2015 01/29/2015 10/15/2014 1/56/1537  Systolic BP 943 276 147 092 957 473 403  Diastolic BP 84 84 82 80 80 84 80  Wt. (Lbs) 190 192 184 186.12 187 181 180  BMI 32.61 32.96 31.57 31.93 33.13 31.05 30.88

## 2016-06-08 NOTE — Assessment & Plan Note (Signed)

## 2016-06-08 NOTE — Progress Notes (Signed)
Christina Williamson     MRN: [id number]      DOB: 08/29/1955   HPI Christina Williamson is here for follow up and re-evaluation of chronic medical conditions, medication management and review of any available recent lab and radiology data.  Preventive health is updated, specifically  Cancer screening and Immunization.   Questions or concerns regarding consultations or procedures which the PT has had in the interim are  addressed. The PT denies any adverse reactions to current medications since the last visit.  There are no new concerns.  There are no specific complaints  Now exercisisng and has changed diet with great results  ROS Denies recent fever or chills. Denies sinus pressure, nasal congestion, ear pain or sore throat. Denies chest congestion, productive cough or wheezing. Denies chest pains, palpitations and leg swelling Denies abdominal pain, nausea, vomiting,diarrhea or constipation.   Denies dysuria, frequency, hesitancy or incontinence. Denies joint pain, swelling and limitation in mobility. Denies headaches, seizures, numbness, or tingling. Denies depression, anxiety or insomnia. Denies skin break down or rash.   PE  BP 118/84   Pulse 75   Resp 16   Ht 5\' 4"  (1.626 m)   Wt 190 lb (86.2 kg)   SpO2 97%   BMI 32.61 kg/m   Patient alert and oriented and in no cardiopulmonary distress.  HEENT: No facial asymmetry, EOMI,   oropharynx pink and moist.  Neck supple no JVD, no mass.  Chest: Clear to auscultation bilaterally.  CVS: S1, S2 no murmurs, no S3.Regular rate.  ABD: Soft non tender.   Ext: No edema  MS: Adequate ROM spine, shoulders, hips and knees.  Skin: Intact, no ulcerations or rash noted.  Psych: Good eye contact, normal affect. Memory intact not anxious or depressed appearing.  CNS: CN 2-12 intact, power,  normal throughout.no focal deficits noted.   Assessment & Plan  Essential hypertension Controlled, no change in medication DASH diet and  commitment to daily physical activity for a minimum of 30 minutes discussed and encouraged, as a part of hypertension management. The importance of attaining a healthy weight is also discussed.  BP/Weight 06/08/2016 01/26/2016 08/11/2015 03/31/2015 01/29/2015 10/15/2014 10/26/32  Systolic BP 917 915 056 979 480 165 537  Diastolic BP 84 84 82 80 80 84 80  Wt. (Lbs) 190 192 184 186.12 187 181 180  BMI 32.61 32.96 31.57 31.93 33.13 31.05 30.88       Obesity (BMI 30.0-34.9) Improved. Pt applauded on succesful weight loss through lifestyle change, and encouraged to continue same. Weight loss goal set for the next several months.   NICOTINE ADDICTION Patient counseled for approximately 5 minutes regarding the health risks of ongoing nicotine use, specifically all types of cancer, heart disease, stroke and respiratory failure. The options available for help with cessation ,the behavioral changes to assist the process, and the option to either gradully reduce usage  Or abruptly stop.is also discussed. Pt is also encouraged to set specific goals in number of cigarettes used daily, as well as to set a quit date.     Prediabetes Improved and currently corrected, applauded on this Patient educated about the importance of limiting  Carbohydrate intake , the need to commit to daily physical activity for a minimum of 30 minutes , and to commit weight loss. The fact that changes in all these areas will reduce or eliminate all together the development of diabetes is stressed.   Diabetic Labs Latest Ref Rng & Units 06/07/2016 12/08/2015 03/24/2015 10/14/2014  06/11/2014  HbA1c <5.7 % 5.6 5.8(H) 5.9(H) 5.8(H) 6.1(H)  Chol <200 mg/dL 195 229(H) - - 173  HDL >50 mg/dL 67 64 - - 71  Calc LDL <100 mg/dL 106(H) 137(H) - - 88  Triglycerides <150 mg/dL 109 142 - - 71  Creatinine 0.50 - 1.05 mg/dL 2.24(H) 1.92(H) 1.79(H) 1.75(H) 2.14(H)   BP/Weight 06/08/2016 01/26/2016 08/11/2015 03/31/2015 01/29/2015 10/15/2014  7/61/5183  Systolic BP 437 357 897 847 841 282 081  Diastolic BP 84 84 82 80 80 84 80  Wt. (Lbs) 190 192 184 186.12 187 181 180  BMI 32.61 32.96 31.57 31.93 33.13 31.05 30.88   No flowsheet data found.    Dyslipidemia Improved, congratulated on this Hyperlipidemia:Low fat diet discussed and encouraged.   Lipid Panel  Lab Results  Component Value Date   CHOL 195 06/07/2016   HDL 67 06/07/2016   LDLCALC 106 (H) 06/07/2016   TRIG 109 06/07/2016   CHOLHDL 2.9 06/07/2016   Continue healthy diet    Metabolic syndrome X The increased risk of cardiovascular disease associated with this diagnosis, and the need to consistently work on lifestyle to change this is discussed. Following  a  heart healthy diet ,commitment to 30 minutes of exercise at least 5 days per week, as well as control of blood sugar and cholesterol , and achieving a healthy weight are all the areas to be addressed .

## 2016-06-08 NOTE — Assessment & Plan Note (Signed)
The increased risk of cardiovascular disease associated with this diagnosis, and the need to consistently work on lifestyle to change this is discussed. Following  a  heart healthy diet ,commitment to 30 minutes of exercise at least 5 days per week, as well as control of blood sugar and cholesterol , and achieving a healthy weight are all the areas to be addressed .  

## 2016-06-08 NOTE — Assessment & Plan Note (Signed)
Improved and currently corrected, applauded on this Patient educated about the importance of limiting  Carbohydrate intake , the need to commit to daily physical activity for a minimum of 30 minutes , and to commit weight loss. The fact that changes in all these areas will reduce or eliminate all together the development of diabetes is stressed.   Diabetic Labs Latest Ref Rng & Units 06/07/2016 12/08/2015 03/24/2015 10/14/2014 06/11/2014  HbA1c <5.7 % 5.6 5.8(H) 5.9(H) 5.8(H) 6.1(H)  Chol <200 mg/dL 195 229(H) - - 173  HDL >50 mg/dL 67 64 - - 71  Calc LDL <100 mg/dL 106(H) 137(H) - - 88  Triglycerides <150 mg/dL 109 142 - - 71  Creatinine 0.50 - 1.05 mg/dL 2.24(H) 1.92(H) 1.79(H) 1.75(H) 2.14(H)   BP/Weight 06/08/2016 01/26/2016 08/11/2015 03/31/2015 01/29/2015 10/15/2014 05/20/252  Systolic BP 270 623 762 831 517 616 073  Diastolic BP 84 84 82 80 80 84 80  Wt. (Lbs) 190 192 184 186.12 187 181 180  BMI 32.61 32.96 31.57 31.93 33.13 31.05 30.88   No flowsheet data found.

## 2016-06-08 NOTE — Patient Instructions (Addendum)
Physical exam first week in December, call if you need me before  CONGRATS on improved labs  CBC, TSH and chem 7 and EGFR  Call for lab order iin October, and have lab drawn 1 week before visit  It is important that you exercise regularly at least 30 minutes 5 times a week. If you develop chest pain, have severe difficulty breathing, or feel very tired, stop exercising immediately and seek medical attention   Thank you  for choosing [facility]. We consider it a privelige to serve you.  Delivering excellent health care in a caring and  compassionate way is our goal.  Partnering with you,  so that together we can achieve this goal is our strategy.

## 2017-01-13 ENCOUNTER — Other Ambulatory Visit: Payer: Self-pay | Admitting: Family Medicine

## 2017-01-13 NOTE — Telephone Encounter (Signed)
Seen [phone] 

## 2017-01-24 ENCOUNTER — Telehealth: Payer: Self-pay | Admitting: *Deleted

## 2017-01-24 DIAGNOSIS — R7303 Prediabetes: Secondary | ICD-10-CM

## 2017-01-24 DIAGNOSIS — E785 Hyperlipidemia, unspecified: Secondary | ICD-10-CM

## 2017-01-24 DIAGNOSIS — I10 Essential (primary) hypertension: Secondary | ICD-10-CM

## 2017-01-24 NOTE — Telephone Encounter (Signed)
LABS ORDERED.

## 2017-01-24 NOTE — Telephone Encounter (Signed)
[facility].

## 2017-01-26 LAB — COMPLETE METABOLIC PANEL WITH GFR
AG Ratio: 1.4 (calc) (ref 1.0–2.5)
ALBUMIN MSPROF: 3.9 g/dL (ref 3.6–5.1)
ALT: 13 U/L (ref 6–29)
AST: 18 U/L (ref 10–35)
Alkaline phosphatase (APISO): 94 U/L (ref 33–130)
BUN / CREAT RATIO: 19 (calc) (ref 6–22)
BUN: 37 mg/dL — AB (ref 7–25)
CALCIUM: 9.2 mg/dL (ref 8.6–10.4)
CO2: 27 mmol/L (ref 20–32)
Chloride: 109 mmol/L (ref 98–110)
Creat: 1.99 mg/dL — ABNORMAL HIGH (ref 0.50–1.05)
GFR, EST AFRICAN AMERICAN: 31 mL/min/{1.73_m2} — AB (ref >=60)
GFR, Est Non African American: 27 mL/min/{1.73_m2} — ABNORMAL LOW (ref >=60)
GLUCOSE: 95 mg/dL (ref 65–99)
Globulin: 2.8 g/dL (calc) (ref 1.9–3.7)
Potassium: 4.6 mmol/L (ref 3.5–5.3)
Sodium: 141 mmol/L (ref 135–146)
TOTAL PROTEIN: 6.7 g/dL (ref 6.1–8.1)
Total Bilirubin: 0.4 mg/dL (ref 0.2–1.2)

## 2017-01-26 LAB — LIPID PANEL
CHOL/HDL RATIO: 2.9 (calc) (ref <5.0)
Cholesterol: 203 mg/dL — ABNORMAL HIGH (ref <200)
HDL: 69 mg/dL (ref >50)
LDL CHOLESTEROL (CALC): 109 mg/dL — AB
Non-HDL Cholesterol (Calc): 134 mg/dL (calc) — ABNORMAL HIGH (ref <130)
TRIGLYCERIDES: 135 mg/dL (ref <150)

## 2017-01-26 LAB — HEMOGLOBIN A1C
Hgb A1c MFr Bld: 5.6 % of total Hgb (ref <5.7)
Mean Plasma Glucose: 114 (calc)
eAG (mmol/L): 6.3 (calc)

## 2017-02-01 ENCOUNTER — Encounter: Payer: Self-pay | Admitting: Family Medicine

## 2017-02-01 ENCOUNTER — Ambulatory Visit (INDEPENDENT_AMBULATORY_CARE_PROVIDER_SITE_OTHER): Payer: BLUE CROSS/BLUE SHIELD | Admitting: Family Medicine

## 2017-02-01 VITALS — BP 118/78 | HR 75 | Resp 16 | Ht 64.0 in | Wt 171.0 lb

## 2017-02-01 DIAGNOSIS — Z1211 Encounter for screening for malignant neoplasm of colon: Secondary | ICD-10-CM

## 2017-02-01 DIAGNOSIS — Z1231 Encounter for screening mammogram for malignant neoplasm of breast: Secondary | ICD-10-CM

## 2017-02-01 DIAGNOSIS — Z23 Encounter for immunization: Secondary | ICD-10-CM

## 2017-02-01 DIAGNOSIS — F17200 Nicotine dependence, unspecified, uncomplicated: Secondary | ICD-10-CM

## 2017-02-01 DIAGNOSIS — E785 Hyperlipidemia, unspecified: Secondary | ICD-10-CM

## 2017-02-01 DIAGNOSIS — Z0000 Encounter for general adult medical examination without abnormal findings: Secondary | ICD-10-CM | POA: Diagnosis not present

## 2017-02-01 DIAGNOSIS — E8881 Metabolic syndrome: Secondary | ICD-10-CM

## 2017-02-01 DIAGNOSIS — R7303 Prediabetes: Secondary | ICD-10-CM

## 2017-02-01 DIAGNOSIS — I10 Essential (primary) hypertension: Secondary | ICD-10-CM

## 2017-02-01 LAB — POC HEMOCCULT BLD/STL (OFFICE/1-CARD/DIAGNOSTIC): FECAL OCCULT BLD: NEGATIVE

## 2017-02-01 NOTE — Assessment & Plan Note (Signed)
Annual exam as documented. . Immunization and cancer screening needs are specifically addressed at this visit.  

## 2017-02-01 NOTE — Patient Instructions (Addendum)
F/u in 6 months, call if you need before  Please get mammogram appt at checkout  Flu vaccine today  Please work on stopping the 3 cigarettes you smoke, brush your teeth instead  It is important that you exercise regularly at least 30 minutes 5 times a week. If you develop chest pain, have severe difficulty breathing, or feel very tired, stop exercising immediately and seek medical attention   CONGRATS on weight loss, keep it up!  Please reduce fried and fatty food, chees,e, butter, egg yolk  Fasting labs in 5.5 months, CBC, tSh, lipid, cmp and eGFR  Commit to 64 oz water daily    Steps to Quit Smoking Smoking tobacco can be bad for your health. It can also affect almost every organ in your body. Smoking puts you and people around you at risk for many serious long-lasting (chronic) diseases. Quitting smoking is hard, but it is one of the best things that you can do for your health. It is never too late to quit. What are the benefits of quitting smoking? When you quit smoking, you lower your risk for getting serious diseases and conditions. They can include:  Lung cancer or lung disease.  Heart disease.  Stroke.  Heart attack.  Not being able to have children (infertility).  Weak bones (osteoporosis) and broken bones (fractures).  If you have coughing, wheezing, and shortness of breath, those symptoms may get better when you quit. You may also get sick less often. If you are pregnant, quitting smoking can help to lower your chances of having a baby of low birth weight. What can I do to help me quit smoking? Talk with your doctor about what can help you quit smoking. Some things you can do (strategies) include:  Quitting smoking totally, instead of slowly cutting back how much you smoke over a period of time.  Going to in-person counseling. You are more likely to quit if you go to many counseling sessions.  Using resources and support systems, such as: ? Database administrator with a  Social worker. ? Phone quitlines. ? Careers information officer. ? Support groups or group counseling. ? Text messaging programs. ? Mobile phone apps or applications.  Taking medicines. Some of these medicines may have nicotine in them. If you are pregnant or breastfeeding, do not take any medicines to quit smoking unless your doctor says it is okay. Talk with your doctor about counseling or other things that can help you.  Talk with your doctor about using more than one strategy at the same time, such as taking medicines while you are also going to in-person counseling. This can help make quitting easier. What things can I do to make it easier to quit? Quitting smoking might feel very hard at first, but there is a lot that you can do to make it easier. Take these steps:  Talk to your family and friends. Ask them to support and encourage you.  Call phone quitlines, reach out to support groups, or work with a Social worker.  Ask people who smoke to not smoke around you.  Avoid places that make you want (trigger) to smoke, such as: ? Bars. ? Parties. ? Smoke-break areas at work.  Spend time with people who do not smoke.  Lower the stress in your life. Stress can make you want to smoke. Try these things to help your stress: ? Getting regular exercise. ? Deep-breathing exercises. ? Yoga. ? Meditating. ? Doing a body scan. To do this, close your eyes,  focus on one area of your body at a time from head to toe, and notice which parts of your body are tense. Try to relax the muscles in those areas.  Download or buy apps on your mobile phone or tablet that can help you stick to your quit plan. There are many free apps, such as QuitGuide from the [employer] Office manager for Disease Control and Prevention). You can find more support from smokefree.gov and other websites.  This information is not intended to replace advice given to you by your health care provider. Make sure you discuss any questions you have  with your health care provider. Document Released: 12/12/2008 Document Revised: 10/14/2015 Document Reviewed: 07/02/2014 Elsevier Interactive Patient Education  2018 [employer].

## 2017-02-01 NOTE — Progress Notes (Signed)
    Christina Williamson     MRN: [id number]      DOB: [birth year]  HPI: Patient is in for annual physical exam. No other health concerns are expressed or addressed at the visit. Recent labs, are reviewed. Immunization is reviewed , and  updated if needed.   PE: BP 118/78   Pulse 75   Resp 16   Ht 5\' 4"  (1.626 m)   Wt 171 lb (77.6 kg)   SpO2 98%   BMI 29.35 kg/m   Pleasant  female, alert and oriented x 3, in no cardio-pulmonary distress. Afebrile. HEENT No facial trauma or asymetry. Sinuses non tender.  Extra occullar muscles intact. External ears normal, tympanic membranes clear. Oropharynx moist, no exudate. Neck: supple, no adenopathy,JVD or thyromegaly.No bruits.  Chest: Clear to ascultation bilaterally.No crackles or wheezes. Non tender to palpation  Breast: No asymetry,no masses or lumps. No tenderness. No nipple discharge or inversion. No axillary or supraclavicular adenopathy  Cardiovascular system; Heart sounds normal,  S1 and  S2 ,no S3.  No murmur, or thrill. Apical beat not displaced Peripheral pulses normal.  Abdomen: Soft, non tender, no organomegaly or masses. No bruits. Bowel sounds normal. No guarding, tenderness or rebound.  Rectal:  Normal sphincter tone. No rectal mass. Guaiac negative stool.  GU: Not examined.  Musculoskeletal exam: Full ROM of spine, hips , shoulders and knees. No deformity ,swelling or crepitus noted. No muscle wasting or atrophy.   Neurologic: Cranial nerves 2 to 12 intact. Power, tone ,sensation and reflexes normal throughout. No disturbance in gait. No tremor.  Skin: Intact, no ulceration, erythema , scaling or rash noted. Pigmentation normal throughout  Psych; Normal mood and affect. Judgement and concentration normal   Assessment & Plan:  Annual physical exam Annual exam as documented.  Immunization and cancer screening needs are specifically addressed at this visit.   NICOTINE ADDICTION Patient  is asked and  confirms current  Nicotine use.  Five to seven minutes of time is spent in counseling the patient of the need to quit smoking  Advice to quit is delivered clearly specifically in reducing the risk of developing heart disease, having a stroke, or of developing all types of cancer, especially lung and oral cancer. Improvement in breathing and exercise tolerance and quality of life is also discussed, as is the economic benefit.  Assessment of willingness to quit or to make an attempt to quit is made and documented  Assistance in quit attempt is made with several and varied options presented, based on patient's desire and need. These include  literature, local classes available, 1800 QUIT NOW number, OTC and prescription medication.  The GOAL to be NICOTINE FREE is re emphasized.  The patient has set a personal goal of either reduction or discontinuation and follow up is arranged between 6 an 16 weeks.

## 2017-02-01 NOTE — Assessment & Plan Note (Signed)

## 2017-02-14 ENCOUNTER — Ambulatory Visit (HOSPITAL_COMMUNITY)
Admission: RE | Admit: 2017-02-14 | Discharge: 2017-02-14 | Disposition: A | Discharge status: Home / Self Care | Payer: BLUE CROSS/BLUE SHIELD | Source: Ambulatory Visit | Attending: Family Medicine | Admitting: Family Medicine

## 2017-02-14 ENCOUNTER — Encounter (HOSPITAL_COMMUNITY): Payer: Self-pay

## 2017-02-14 DIAGNOSIS — Z1231 Encounter for screening mammogram for malignant neoplasm of breast: Secondary | ICD-10-CM | POA: Diagnosis not present

## 2017-07-30 ENCOUNTER — Other Ambulatory Visit: Payer: Self-pay | Admitting: Family Medicine

## 2017-08-02 ENCOUNTER — Ambulatory Visit (INDEPENDENT_AMBULATORY_CARE_PROVIDER_SITE_OTHER): Payer: BLUE CROSS/BLUE SHIELD | Admitting: Family Medicine

## 2017-08-02 ENCOUNTER — Encounter: Payer: Self-pay | Admitting: Family Medicine

## 2017-08-02 VITALS — BP 120/78 | HR 71 | Resp 16 | Ht 64.0 in | Wt 166.0 lb

## 2017-08-02 DIAGNOSIS — E785 Hyperlipidemia, unspecified: Secondary | ICD-10-CM

## 2017-08-02 DIAGNOSIS — F17200 Nicotine dependence, unspecified, uncomplicated: Secondary | ICD-10-CM

## 2017-08-02 DIAGNOSIS — R7303 Prediabetes: Secondary | ICD-10-CM

## 2017-08-02 DIAGNOSIS — Z23 Encounter for immunization: Secondary | ICD-10-CM | POA: Diagnosis not present

## 2017-08-02 DIAGNOSIS — D631 Anemia in chronic kidney disease: Secondary | ICD-10-CM

## 2017-08-02 DIAGNOSIS — I129 Hypertensive chronic kidney disease with stage 1 through stage 4 chronic kidney disease, or unspecified chronic kidney disease: Secondary | ICD-10-CM

## 2017-08-02 DIAGNOSIS — I10 Essential (primary) hypertension: Secondary | ICD-10-CM | POA: Diagnosis not present

## 2017-08-02 DIAGNOSIS — Z1211 Encounter for screening for malignant neoplasm of colon: Secondary | ICD-10-CM

## 2017-08-02 DIAGNOSIS — N184 Chronic kidney disease, stage 4 (severe): Secondary | ICD-10-CM | POA: Diagnosis not present

## 2017-08-02 LAB — POC HEMOCCULT BLD/STL (OFFICE/1-CARD/DIAGNOSTIC): Fecal Occult Blood, POC: NEGATIVE

## 2017-08-02 MED ORDER — TRIAMTERENE-HCTZ 75-50 MG PO TABS: 1.0000 | ORAL_TABLET | Freq: Every day | ORAL | 1 refills | Status: DC

## 2017-08-02 MED ORDER — BENAZEPRIL HCL 40 MG PO TABS: 40.0000 mg | ORAL_TABLET | Freq: Every day | ORAL | 1 refills | Status: DC

## 2017-08-02 MED ORDER — AMLODIPINE BESYLATE 10 MG PO TABS: 10.0000 mg | ORAL_TABLET | Freq: Every day | ORAL | 1 refills | Status: DC

## 2017-08-02 NOTE — Patient Instructions (Addendum)
Annual physical exam Dec 5 or after, call if you need me sooner  Shingrix #1 today  Rectal exam today to check for hidden blood  Need to stop sweet tea, blood sugar has increased  Need to drink 64 to 70 ounces water every day, even when getting ;labs  Repeat chem 7 and eGFR next week Monday  You are referred to Nephrologist for follow up of kidney disease  Need to STOP smoking cigarettes entirely, even 2 cigarettes every day is damaging your health and increasing your risk of heart disease, stroke and all types of cancer  It is important that you exercise regularly at least 30 minutes 5 times a week. If you develop chest pain, have severe difficulty breathing, or feel very tired, stop exercising immediately and seek medical attention    Thank you  for choosing [facility]. We consider it a privelige to serve you.  Delivering excellent health care in a caring and  compassionate way is our goal.  Partnering with you,  so that together we can achieve this goal is our strategy.

## 2017-08-03 LAB — CBC
HEMATOCRIT: 34.2 % — AB (ref 35.0–45.0)
Hemoglobin: 11.4 g/dL — ABNORMAL LOW (ref 11.7–15.5)
MCH: 31.2 pg (ref 27.0–33.0)
MCHC: 33.3 g/dL (ref 32.0–36.0)
MCV: 93.7 fL (ref 80.0–100.0)
MPV: 9.7 fL (ref 7.5–12.5)
PLATELETS: 284 10*3/uL (ref 140–400)
RBC: 3.65 10*6/uL — AB (ref 3.80–5.10)
RDW: 12.2 % (ref 11.0–15.0)
WBC: 5.4 10*3/uL (ref 3.8–10.8)

## 2017-08-03 LAB — LIPID PANEL
CHOLESTEROL: 208 mg/dL — AB (ref <200)
HDL: 67 mg/dL (ref >50)
LDL Cholesterol (Calc): 120 mg/dL (calc) — ABNORMAL HIGH
Non-HDL Cholesterol (Calc): 141 mg/dL (calc) — ABNORMAL HIGH (ref <130)
Total CHOL/HDL Ratio: 3.1 (calc) (ref <5.0)
Triglycerides: 107 mg/dL (ref <150)

## 2017-08-03 LAB — COMPLETE METABOLIC PANEL WITH GFR
AG RATIO: 1.4 (calc) (ref 1.0–2.5)
ALT: 15 U/L (ref 6–29)
AST: 20 U/L (ref 10–35)
Albumin: 4 g/dL (ref 3.6–5.1)
Alkaline phosphatase (APISO): 93 U/L (ref 33–130)
BILIRUBIN TOTAL: 0.5 mg/dL (ref 0.2–1.2)
BUN / CREAT RATIO: 25 (calc) — AB (ref 6–22)
BUN: 69 mg/dL — ABNORMAL HIGH (ref 7–25)
CALCIUM: 9.3 mg/dL (ref 8.6–10.4)
CHLORIDE: 104 mmol/L (ref 98–110)
CO2: 30 mmol/L (ref 20–32)
Creat: 2.75 mg/dL — ABNORMAL HIGH (ref 0.50–0.99)
GFR, EST AFRICAN AMERICAN: 21 mL/min/{1.73_m2} — AB (ref >=60)
GFR, EST NON AFRICAN AMERICAN: 18 mL/min/{1.73_m2} — AB (ref >=60)
GLOBULIN: 2.8 g/dL (ref 1.9–3.7)
Glucose, Bld: 105 mg/dL — ABNORMAL HIGH (ref 65–99)
POTASSIUM: 4.5 mmol/L (ref 3.5–5.3)
SODIUM: 141 mmol/L (ref 135–146)
TOTAL PROTEIN: 6.8 g/dL (ref 6.1–8.1)

## 2017-08-03 LAB — FERRITIN: FERRITIN: 232 ng/mL (ref 20–288)

## 2017-08-03 LAB — HEMOGLOBIN A1C W/OUT EAG: Hgb A1c MFr Bld: 5.7 % of total Hgb — ABNORMAL HIGH (ref <5.7)

## 2017-08-03 LAB — IRON: IRON: 99 ug/dL (ref 45–160)

## 2017-08-03 LAB — TSH: TSH: 1.96 mIU/L (ref 0.40–4.50)

## 2017-08-07 ENCOUNTER — Encounter: Payer: Self-pay | Admitting: Family Medicine

## 2017-08-07 DIAGNOSIS — D649 Anemia, unspecified: Secondary | ICD-10-CM | POA: Insufficient documentation

## 2017-08-07 NOTE — Assessment & Plan Note (Signed)
Recent labs show deterioration in renal function however pt dehydrated, repeat lab I 1 week with improved hydration, needs annual eval by nephrologist however. Pt denies use of any NSAIDS

## 2017-08-07 NOTE — Assessment & Plan Note (Signed)
Unchanged, HBA1C remains at 5.7 , encouraged to  Limit carbs to lower this

## 2017-08-07 NOTE — Assessment & Plan Note (Signed)
Hyperlipidemia:Low fat diet discussed and encouraged.   Lipid Panel  Lab Results  Component Value Date   CHOL 208 (H) 08/01/2017   HDL 67 08/01/2017   LDLCALC 120 (H) 08/01/2017   TRIG 107 08/01/2017   CHOLHDL 3.1 08/01/2017   Needs to lower fat intake

## 2017-08-07 NOTE — Assessment & Plan Note (Signed)
Rectal exam in office negative for hidden blood, iron level normal, anemia likle due to CKD

## 2017-08-07 NOTE — Assessment & Plan Note (Addendum)
Asked: confirm currently smokes 2 to 3 cigarettes daily Assess: not wiling to set quit date Advise: need to quit to reduce cancer risk, decreased risk of heart disease and stroke and improve lung function Assist: info on smoking cessation is provided also 1800 QUIT NOW number ,  Arrange : f/u in 6 months Time spent 4 mins

## 2017-08-07 NOTE — Progress Notes (Signed)
Christina Williamson     MRN: [id number]      DOB: [birth year]   HPI Christina Williamson is here for follow up and re-evaluation of chronic medical conditions, medication management and review of any available recent lab and radiology data.  Preventive health is updated, specifically  Cancer screening and Immunization.   . The PT denies any adverse reactions to current medications since the last visit.  C/o fatigue when driving to [facility], requests even 5 phentermine tablets to help with the trip, however her kidney function seems to have deteriorated, in recent labs, mat be falsely so because of dehydration so waiting on repeat lab to make a determination.  Still needs to stop smoking and not commit ing to quit yet. Stressed in an unhealthy marriage which she is tied to, but making the best of it, health challenges in the family  ROS Denies recent fever or chills. Denies sinus pressure, nasal congestion, ear pain or sore throat. Denies chest congestion, productive cough or wheezing. Denies chest pains, palpitations and leg swelling Denies abdominal pain, nausea, vomiting,diarrhea or constipation.   Denies dysuria, frequency, hesitancy or incontinence. Denies joint pain, swelling and limitation in mobility. Denies headaches, seizures, numbness, or tingling. Denies depression, anxiety or insomnia. Denies skin break down or rash.   PE  BP 120/78   Pulse 71   Resp 16   Ht 5\' 4"  (1.626 m)   Wt 166 lb (75.3 kg)   SpO2 96%   BMI 28.49 kg/m   Patient alert and oriented and in no cardiopulmonary distress.  HEENT: No facial asymmetry, EOMI,   oropharynx pink and moist.  Neck supple no JVD, no mass.  Chest: Clear to auscultation bilaterally.  CVS: S1, S2 no murmurs, no S3.Regular rate.  ABD: Soft non tender.  Rectal: no mass, heme negative stool Ext: No edema  MS: Adequate ROM spine, shoulders, hips and knees.  Skin: Intact, no ulcerations or rash noted.  Psych: Good eye contact,  normal affect. Memory intact not anxious or depressed appearing.  CNS: CN 2-12 intact, power,  normal throughout.no focal deficits noted.   Assessment & Plan  Essential hypertension Controlled, no change in medication DASH diet and commitment to daily physical activity for a minimum of 30 minutes discussed and encouraged, as a part of hypertension management. The importance of attaining a healthy weight is also discussed.  BP/Weight 08/02/2017 02/01/2017 06/08/2016 01/26/2016 08/11/2015 03/31/2015 26/94/8546  Systolic BP 270 350 093 818 299 371 696  Diastolic BP 78 78 84 84 82 80 80  Wt. (Lbs) 166 171 190 192 184 186.12 187  BMI 28.49 29.35 32.61 32.96 31.57 31.93 33.13       CKD stage 4 secondary to hypertension (HCC) Recent labs show deterioration in renal function however pt dehydrated, repeat lab I 1 week with improved hydration, needs annual eval by nephrologist however. Pt denies use of any NSAIDS  NICOTINE ADDICTION Asked: confirm currently smokes 2 to 3 cigarettes daily Assess: not wiling to set quit date Advise: need to quit to reduce cancer risk, decreased risk of heart disease and stroke and improve lung function Assist: info on smoking cessation is provided also 1800 QUIT NOW number ,  Arrange : f/u in 6 months Time spent 4 mins  Anemia Rectal exam in office negative for hidden blood, iron level normal, anemia likle due to CKD  Dyslipidemia Hyperlipidemia:Low fat diet discussed and encouraged.   Lipid Panel  Lab Results  Component Value Date  CHOL 208 (H) 08/01/2017   HDL 67 08/01/2017   LDLCALC 120 (H) 08/01/2017   TRIG 107 08/01/2017   CHOLHDL 3.1 08/01/2017   Needs to lower fat intake     Prediabetes Unchanged, HBA1C remains at 5.7 , encouraged to  Limit carbs to lower this

## 2017-08-07 NOTE — Assessment & Plan Note (Signed)
Controlled, no change in medication DASH diet and commitment to daily physical activity for a minimum of 30 minutes discussed and encouraged, as a part of hypertension management. The importance of attaining a healthy weight is also discussed.  BP/Weight 08/02/2017 02/01/2017 06/08/2016 01/26/2016 08/11/2015 03/31/2015 30/14/9969  Systolic BP 249 324 199 144 458 483 507  Diastolic BP 78 78 84 84 82 80 80  Wt. (Lbs) 166 171 190 192 184 186.12 187  BMI 28.49 29.35 32.61 32.96 31.57 31.93 33.13

## 2017-08-08 LAB — BASIC METABOLIC PANEL WITH GFR
BUN/Creatinine Ratio: 24 (calc) — ABNORMAL HIGH (ref 6–22)
BUN: 52 mg/dL — AB (ref 7–25)
CALCIUM: 9.2 mg/dL (ref 8.6–10.4)
CO2: 27 mmol/L (ref 20–32)
CREATININE: 2.19 mg/dL — AB (ref 0.50–0.99)
Chloride: 108 mmol/L (ref 98–110)
GFR, EST AFRICAN AMERICAN: 27 mL/min/{1.73_m2} — AB (ref >=60)
GFR, EST NON AFRICAN AMERICAN: 24 mL/min/{1.73_m2} — AB (ref >=60)
Glucose, Bld: 88 mg/dL (ref 65–99)
Potassium: 4.4 mmol/L (ref 3.5–5.3)
Sodium: 142 mmol/L (ref 135–146)

## 2018-01-10 DIAGNOSIS — D631 Anemia in chronic kidney disease: Secondary | ICD-10-CM | POA: Insufficient documentation

## 2018-02-06 ENCOUNTER — Ambulatory Visit (HOSPITAL_COMMUNITY): Payer: Self-pay

## 2018-02-06 ENCOUNTER — Encounter: Payer: Self-pay | Admitting: Family Medicine

## 2018-02-06 ENCOUNTER — Ambulatory Visit (INDEPENDENT_AMBULATORY_CARE_PROVIDER_SITE_OTHER): Payer: Self-pay | Admitting: Family Medicine

## 2018-02-06 ENCOUNTER — Other Ambulatory Visit (HOSPITAL_COMMUNITY)
Admission: RE | Admit: 2018-02-06 | Discharge: 2018-02-06 | Disposition: A | Discharge status: Home / Self Care | Payer: Self-pay | Source: Ambulatory Visit | Attending: Family Medicine | Admitting: Family Medicine

## 2018-02-06 VITALS — BP 114/64 | HR 74 | Resp 14 | Ht 63.0 in | Wt 178.1 lb

## 2018-02-06 DIAGNOSIS — I10 Essential (primary) hypertension: Secondary | ICD-10-CM

## 2018-02-06 DIAGNOSIS — Z1231 Encounter for screening mammogram for malignant neoplasm of breast: Secondary | ICD-10-CM

## 2018-02-06 DIAGNOSIS — Z1272 Encounter for screening for malignant neoplasm of vagina: Secondary | ICD-10-CM

## 2018-02-06 DIAGNOSIS — Z0000 Encounter for general adult medical examination without abnormal findings: Secondary | ICD-10-CM

## 2018-02-06 DIAGNOSIS — J209 Acute bronchitis, unspecified: Secondary | ICD-10-CM | POA: Insufficient documentation

## 2018-02-06 DIAGNOSIS — F17200 Nicotine dependence, unspecified, uncomplicated: Secondary | ICD-10-CM

## 2018-02-06 DIAGNOSIS — R7303 Prediabetes: Secondary | ICD-10-CM

## 2018-02-06 DIAGNOSIS — E785 Hyperlipidemia, unspecified: Secondary | ICD-10-CM

## 2018-02-06 MED ORDER — SULFAMETHOXAZOLE-TRIMETHOPRIM 800-160 MG PO TABS: 1.0000 | ORAL_TABLET | Freq: Two times a day (BID) | ORAL | 0 refills | Status: DC

## 2018-02-06 MED ORDER — BENZONATATE 100 MG PO CAPS: 100.0000 mg | ORAL_CAPSULE | Freq: Two times a day (BID) | ORAL | 0 refills | Status: DC | PRN

## 2018-02-06 NOTE — Patient Instructions (Addendum)
F/u in 6 months, call if you need me sooner  Mammogram due in December to be scheduled at checkout  Two medications are prescribed for acute bronchitis  Smoking 3 cigarettes daily, need to [place]  Two medications are sent for acute bronchitis to your pharmacy  Please come in on a Monday as we discussed for your flu vaccine in the next approximately 2 weeks  Pls get labs in February when you are getting labs for Kidney Doc, lipid, cmp and EGFr, CBC, hBA1C, tSH and vit D  DRINK at least 64 ounces water daily. STOP SMOKING Avoid processed foods Eat a lot of fruit and vegetables. Commit to regular exercise All these for kidney and heart health    Steps to Quit Smoking Smoking tobacco can be bad for your health. It can also affect almost every organ in your body. Smoking puts you and people around you at risk for many serious long-lasting (chronic) diseases. Quitting smoking is hard, but it is one of the best things that you can do for your health. It is never too late to quit. What are the benefits of quitting smoking? When you quit smoking, you lower your risk for getting serious diseases and conditions. They can include:  Lung cancer or lung disease.  Heart disease.  Stroke.  Heart attack.  Not being able to have children (infertility).  Weak bones (osteoporosis) and broken bones (fractures).  If you have coughing, wheezing, and shortness of breath, those symptoms may get better when you quit. You may also get sick less often. If you are pregnant, quitting smoking can help to lower your chances of having a baby of low birth weight. What can I do to help me quit smoking? Talk with your doctor about what can help you quit smoking. Some things you can do (strategies) include:  Quitting smoking totally, instead of slowly cutting back how much you smoke over a period of time.  Going to in-person counseling. You are more likely to quit if you go to many counseling  sessions.  Using resources and support systems, such as: ? Database administrator with a Social worker. ? Phone quitlines. ? Careers information officer. ? Support groups or group counseling. ? Text messaging programs. ? Mobile phone apps or applications.  Taking medicines. Some of these medicines may have nicotine in them. If you are pregnant or breastfeeding, do not take any medicines to quit smoking unless your doctor says it is okay. Talk with your doctor about counseling or other things that can help you.  Talk with your doctor about using more than one strategy at the same time, such as taking medicines while you are also going to in-person counseling. This can help make quitting easier. What things can I do to make it easier to quit? Quitting smoking might feel very hard at first, but there is a lot that you can do to make it easier. Take these steps:  Talk to your family and friends. Ask them to support and encourage you.  Call phone quitlines, reach out to support groups, or work with a Social worker.  Ask people who smoke to not smoke around you.  Avoid places that make you want (trigger) to smoke, such as: ? Bars. ? Parties. ? Smoke-break areas at work.  Spend time with people who do not smoke.  Lower the stress in your life. Stress can make you want to smoke. Try these things to help your stress: ? Getting regular exercise. ? Deep-breathing exercises. ? Yoga. ?  Meditating. ? Doing a body scan. To do this, close your eyes, focus on one area of your body at a time from head to toe, and notice which parts of your body are tense. Try to relax the muscles in those areas.  Download or buy apps on your mobile phone or tablet that can help you stick to your quit plan. There are many free apps, such as QuitGuide from the [employer] Office manager for Disease Control and Prevention). You can find more support from smokefree.gov and other websites.  This information is not intended to replace advice given to  you by your health care provider. Make sure you discuss any questions you have with your health care provider. Document Released: 12/12/2008 Document Revised: 10/14/2015 Document Reviewed: 07/02/2014 Elsevier Interactive Patient Education  2018 [employer].

## 2018-02-06 NOTE — Assessment & Plan Note (Signed)
Antibiotic and decongestant are prescribed

## 2018-02-06 NOTE — Progress Notes (Signed)
    Christina Williamson     MRN: [id number]      DOB: 22-Mar-1955  HPI: Patient is in for annual physical exam. 2 week h/o cough and chest congestion   Immunization is reviewed , need flu vaccine but is sick today and will return   PE: BP 114/64 (BP Location: Right Arm, Patient Position: Sitting, Cuff Size: Large)   Pulse 74   Resp 14   Ht 5\' 3"  (1.6 m)   Wt 178 lb 1.3 oz (80.8 kg)   SpO2 98%   BMI 31.55 kg/m   Pleasant  female, alert and oriented x 3, in no cardio-pulmonary distress. Afebrile. HEENT No facial trauma or asymetry. Sinuses non tender.  Extra occullar muscles intact, pupils equally reactive to light. External ears normal, tympanic membranes clear. Oropharynx moist, no exudate. Neck: supple, no adenopathy,JVD or thyromegaly.No bruits.  Chest: Decreased air entry, scattered crackles, no wheezes  Non tender to palpation  Breast: No asymetry,no masses or lumps. No tenderness. No nipple discharge or inversion. No axillary or supraclavicular adenopathy  Cardiovascular system; Heart sounds normal,  S1 and  S2 ,no S3.  No murmur, or thrill. Apical beat not displaced Peripheral pulses normal.  Abdomen: Soft, non tender, no organomegaly or masses. No bruits. Bowel sounds normal. No guarding, tenderness or rebound.  .  GU: External genitalia normal female genitalia , normal female distribution of hair. No lesions. Urethral meatus normal in size, no  Prolapse, no lesions visibly  Present. Bladder non tender. Vagina pink and moist , with no visible lesions , discharge present . Adequate pelvic support no  cystocele or rectocele noted Uterusabsent, no adnexal masses, no  adnexal tenderness.   Musculoskeletal exam: Full ROM of spine, hips , shoulders and knees. No deformity ,swelling or crepitus noted. No muscle wasting or atrophy.   Neurologic: Cranial nerves 2 to 12 intact. Power, tone ,sensation and reflexes normal throughout. No disturbance in gait.  No tremor.  Skin: Intact, no ulceration, erythema , scaling or rash noted. Pigmentation normal throughout  Psych; Normal mood and affect. Judgement and concentration normal   Assessment & Plan:  Annual physical exam Annual exam as documented. Counseling done  re healthy lifestyle involving commitment to 150 minutes exercise per week, heart healthy diet, and attaining healthy weight.The importance of adequate sleep also discussed.  Changes in health habits are decided on by the patient with goals and time frames  set for achieving them. Immunization and cancer screening needs are specifically addressed at this visit.   NICOTINE ADDICTION Asked:confirms currently smokes 2 cigarettes/ day Assess: Unwilling to actually set a  Quit date  but states she does want to quit, only smokes after meals Advise: needs to QUIT to reduce risk of cancer, cardio and cerebrovascular disease Assist: counseled for 5 minutes and literature provided Arrange: follow up in 3 months   Acute bronchitis Antibiotic and decongestant are prescribed

## 2018-02-06 NOTE — Assessment & Plan Note (Signed)
Annual exam as documented. Counseling done  re healthy lifestyle involving commitment to 150 minutes exercise per week, heart healthy diet, and attaining healthy weight.The importance of adequate sleep also discussed. Changes in health habits are decided on by the patient with goals and time frames  set for achieving them. Immunization and cancer screening needs are specifically addressed at this visit. 

## 2018-02-06 NOTE — Assessment & Plan Note (Signed)
Asked:confirms currently smokes 2 cigarettes/ day Assess: Unwilling to actually set a  Quit date  but states she does want to quit, only smokes after meals Advise: needs to QUIT to reduce risk of cancer, cardio and cerebrovascular disease Assist: counseled for 5 minutes and literature provided Arrange: follow up in 3 months

## 2018-02-08 LAB — CYTOLOGY - PAP
DIAGNOSIS: NEGATIVE
HPV (WINDOPATH): NOT DETECTED

## 2018-02-09 ENCOUNTER — Encounter: Payer: Self-pay | Admitting: Family Medicine

## 2018-03-06 ENCOUNTER — Ambulatory Visit (HOSPITAL_COMMUNITY): Payer: Self-pay

## 2018-03-20 ENCOUNTER — Ambulatory Visit (HOSPITAL_COMMUNITY): Payer: Self-pay

## 2018-04-21 ENCOUNTER — Telehealth: Payer: Self-pay | Admitting: *Deleted

## 2018-04-21 NOTE — Telephone Encounter (Signed)
Pt called stating Dr. Moshe Cipro had given her something for a cold about a month ago. Wanted to know if the same medicine could be called in again. She had a fever but now she does not. She just has congestion in her chest no other symptoms. Just wanted to get rid of the congestion.

## 2018-04-21 NOTE — Telephone Encounter (Signed)
pls refill script for tessalon perles x 1 and let her know

## 2018-04-21 NOTE — Telephone Encounter (Signed)
Patient advised with verbal understanding  

## 2018-04-21 NOTE — Telephone Encounter (Signed)
Please advise 

## 2018-04-24 ENCOUNTER — Other Ambulatory Visit: Payer: Self-pay

## 2018-04-24 MED ORDER — BENZONATATE 100 MG PO CAPS: 100.0000 mg | ORAL_CAPSULE | Freq: Two times a day (BID) | ORAL | 0 refills | Status: DC | PRN

## 2018-05-01 ENCOUNTER — Other Ambulatory Visit: Payer: Self-pay | Admitting: Family Medicine

## 2018-07-27 ENCOUNTER — Encounter: Payer: Self-pay | Admitting: Family Medicine

## 2018-07-27 ENCOUNTER — Other Ambulatory Visit: Payer: Self-pay

## 2018-07-27 ENCOUNTER — Ambulatory Visit (INDEPENDENT_AMBULATORY_CARE_PROVIDER_SITE_OTHER): Payer: Self-pay | Admitting: Family Medicine

## 2018-07-27 ENCOUNTER — Encounter (INDEPENDENT_AMBULATORY_CARE_PROVIDER_SITE_OTHER): Payer: Self-pay

## 2018-07-27 VITALS — BP 114/64 | Ht 63.0 in | Wt 179.0 lb

## 2018-07-27 DIAGNOSIS — F17200 Nicotine dependence, unspecified, uncomplicated: Secondary | ICD-10-CM

## 2018-07-27 DIAGNOSIS — N184 Chronic kidney disease, stage 4 (severe): Secondary | ICD-10-CM

## 2018-07-27 DIAGNOSIS — K0889 Other specified disorders of teeth and supporting structures: Secondary | ICD-10-CM

## 2018-07-27 DIAGNOSIS — I129 Hypertensive chronic kidney disease with stage 1 through stage 4 chronic kidney disease, or unspecified chronic kidney disease: Secondary | ICD-10-CM

## 2018-07-27 DIAGNOSIS — Z1231 Encounter for screening mammogram for malignant neoplasm of breast: Secondary | ICD-10-CM

## 2018-07-27 DIAGNOSIS — Z7189 Other specified counseling: Secondary | ICD-10-CM | POA: Insufficient documentation

## 2018-07-27 DIAGNOSIS — I10 Essential (primary) hypertension: Secondary | ICD-10-CM

## 2018-07-27 DIAGNOSIS — R7303 Prediabetes: Secondary | ICD-10-CM

## 2018-07-27 DIAGNOSIS — E663 Overweight: Secondary | ICD-10-CM

## 2018-07-27 DIAGNOSIS — E785 Hyperlipidemia, unspecified: Secondary | ICD-10-CM

## 2018-07-27 MED ORDER — DOXYCYCLINE HYCLATE 100 MG PO TABS: 100.0000 mg | ORAL_TABLET | Freq: Two times a day (BID) | ORAL | 0 refills | Status: DC

## 2018-07-27 NOTE — Assessment & Plan Note (Signed)
Controlled, no change in medication DASH diet and commitment to daily physical activity for a minimum of 30 minutes discussed and encouraged, as a part of hypertension management. The importance of attaining a healthy weight is also discussed.  BP/Weight 07/27/2018 02/06/2018 08/02/2017 02/01/2017 06/08/2016 01/26/2016 3/81/7711  Systolic BP 657 903 833 383 291 916 606  Diastolic BP 64 64 78 78 84 84 82  Wt. (Lbs) 179 178.08 166 171 190 192 184  BMI 31.71 31.55 28.49 29.35 32.61 32.96 31.57

## 2018-07-27 NOTE — Assessment & Plan Note (Signed)
Asked:confirms currently smokes cigarettes3 /day, " has a plan" but no quit date commitment Assess: Unwilling to quit but cutting back Advise: needs to QUIT to reduce risk of cancer, cardio and cerebrovascular disease Assist: counseled for 5 minutes and literature provided Arrange: follow up in 3 months

## 2018-07-27 NOTE — Assessment & Plan Note (Signed)
Patient educated about the importance of limiting  Carbohydrate intake , the need to commit to daily physical activity for a minimum of 30 minutes , and to commit weight loss. The fact that changes in all these areas will reduce or eliminate all together the development of diabetes is stressed.  Updated lab needed at/ before next visit.  Diabetic Labs Latest Ref Rng & Units 08/08/2017 08/01/2017 01/25/2017 06/07/2016 12/08/2015  HbA1c <5.7 % of total Hgb - 5.7(H) 5.6 5.6 5.8(H)  Chol <200 mg/dL - 208(H) 203(H) 195 229(H)  HDL >50 mg/dL - 67 69 67 64  Calc LDL mg/dL (calc) - 120(H) 109(H) 106(H) 137(H)  Triglycerides <150 mg/dL - 107 135 109 142  Creatinine 0.50 - 0.99 mg/dL 2.19(H) 2.75(H) 1.99(H) 2.24(H) 1.92(H)   BP/Weight 07/27/2018 02/06/2018 08/02/2017 02/01/2017 06/08/2016 01/26/2016 10/28/5619  Systolic BP 308 657 846 962 952 841 324  Diastolic BP 64 64 78 78 84 84 82  Wt. (Lbs) 179 178.08 166 171 190 192 184  BMI 31.71 31.55 28.49 29.35 32.61 32.96 31.57   No flowsheet data found.

## 2018-07-27 NOTE — Assessment & Plan Note (Signed)
Request for NSAID for dental pain denied, advised use of tylenol and asap appt with dentisit. Antibioitc also prescribed

## 2018-07-27 NOTE — Progress Notes (Signed)
Virtual Visit via Telephone Note  I connected with Christina Williamson on 07/27/18 at  4:00 PM EDT by telephone and verified that I am speaking with the correct person using two identifiers.  Location: Patient: patient Provider: office   I discussed the limitations, risks, security and privacy concerns of performing an evaluation and management service by telephone and the availability of in person appointments. I also discussed with the patient that there may be a patient responsible charge related to this service. The patient expressed understanding and agreed to proceed.   History of Present Illness: F/u chronic problems and update routine health I have dental ain and cannot get work done right now, please help I would like to resume weight loss medication since I am putting back on weight Denies recent fever or chills. Denies sinus pressure, nasal congestion, ear pain or sore throat. Denies chest congestion, productive cough or wheezing. Denies chest pains, palpitations and leg swelling Denies abdominal pain, nausea, vomiting,diarrhea or constipation.   Denies dysuria, frequency, hesitancy or incontinence. Denies joint pain, swelling and limitation in mobility. Denies headaches, seizures, numbness, or tingling. Denies depression, anxiety or insomnia. Denies skin break down or rash.       Observations/Objective: BP 114/64   Ht 5\' 3"  (1.6 m)   Wt 179 lb (81.2 kg)   BMI 31.71 kg/m  Good communication with no confusion and intact memory. Alert and oriented x 3 No signs of respiratory distress during sppech    Assessment and Plan: Covid-19 Education  The signs and symptoms of of COVID -19 were discussed with the patient and how to seek care for testing. ( follow up with PCP or arrange  E-visit) The importance of social  distancing is discussed today.    Follow Up Instructions:    I discussed the assessment and treatment plan with the patient. The patient was provided  an opportunity to ask questions and all were answered. The patient agreed with the plan and demonstrated an understanding of the instructions.   The patient was advised to call back or seek an in-person evaluation if the symptoms worsen or if the condition fails to improve as anticipated.  I provided 21 minutes of non-face-to-face time during this encounter.   Tula Nakayama, MD

## 2018-07-27 NOTE — Assessment & Plan Note (Signed)
Deteriorated.  Patient re-educated about  the importance of commitment to a  minimum of 150 minutes of exercise per week as able.  The importance of healthy food choices with portion control discussed, as well as eating regularly and within a 12 hour window most days. The need to choose "clean , green" food 50 to 75% of the time is discussed, as well as to make water the primary drink and set a goal of 64 ounces water daily.  Encouraged to start a food diary,  and to consider  joining a support group. Sample diet sheets offered. Goals set by the patient for the next several months.   Weight /BMI 07/27/2018 02/06/2018 08/02/2017  WEIGHT 179 lb 178 lb 1.3 oz 166 lb  HEIGHT 5\' 3"  5\' 3"  5\' 4"   BMI 31.71 kg/m2 31.55 kg/m2 28.49 kg/m2  Will start half phentermine daily, once updated labs obtained

## 2018-07-27 NOTE — Assessment & Plan Note (Signed)
Needs asap Dental appt. Short antibiotic course also prescribed

## 2018-07-27 NOTE — Assessment & Plan Note (Signed)
Covid-19 Education  The signs and symptoms of of COVID -19 were discussed with the patient and how to seek care for testing. ( follow up with PCP or arrange  E-visit) The importance of social  distancing is discussed today.  

## 2018-07-27 NOTE — Patient Instructions (Signed)
F/U in 4 month, call if you need me before E  Please get fasting labs as soon as possible  Half phentermine once daily will be prescribed after your labs are reviewed  We will contact you re procedure to get free mammogram as you currently are uninsured before scheduling  Antibiotic is presribed for tooth, you need dental care  Ibuprofen is tOXIC for you, only tylenol  Can be used for p[ain  It is important that you exercise regularly at least 30 minutes 5 times a week. If you develop chest pain, have severe difficulty breathing, or feel very tired, stop exercising immediately and seek medical attention   Think about what you will eat, plan ahead. Choose " clean, green, fresh or frozen" over canned, processed or packaged foods which are more sugary, salty and fatty. 70 to 75% of food eaten should be vegetables and fruit. Three meals at set times with snacks allowed between meals, but they must be fruit or vegetables. Aim to eat over a 12 hour period , example 7 am to 7 pm, and STOP after  your last meal of the day. Drink water,generally about 64 ounces per day, no other drink is as healthy. Fruit juice is best enjoyed in a healthy way, by EATING the fruit.  Now smoking 3/ day, follow yOUR quit plan  Social distancing.Avoid crowds and maintain a 6 ft distance Frequent hand washing with soap and water Keeping your hands off of your face.Wear a mask These 3 practices will help to keep both you and your community healthy during this time. Please practice them faithfully!

## 2018-07-27 NOTE — Assessment & Plan Note (Signed)
Hyperlipidemia:Low fat diet discussed and encouraged.   Lipid Panel  Lab Results  Component Value Date   CHOL 208 (H) 08/01/2017   HDL 67 08/01/2017   LDLCALC 120 (H) 08/01/2017   TRIG 107 08/01/2017   CHOLHDL 3.1 08/01/2017     Updated lab needed at/ before next visit.

## 2018-07-28 ENCOUNTER — Telehealth: Payer: Self-pay | Admitting: *Deleted

## 2018-07-28 NOTE — Telephone Encounter (Signed)
[place] [facility]. She does have a program and needs the pt to call her at [phone] to get all the paper work completed. She is going to be scheduled out to July due to Covid 19 and she would have to go face to face at the health dept. Called pt to give her the information. Told her to to leave a voicemail for [facility] and she would call her back. She stated she had no income so she should qualify for the help at [facility].Nance Pew said once she calls and get her set up they would call Forestine Na and set her mammogram up so it would not cost her anything out of pocket. Pt understood. Told the pt to call us back if she had not heard from her by the end of next week and we would reach back out to the health dept.

## 2018-07-28 NOTE — Telephone Encounter (Signed)
[place] [facility] left a message for Christina Williamson as she is the coordinator for mammogram program requesting information on how to get help for Christina Williamson with her mammogram. Also tried [facility] in [place]) and got no answer as they are open Monday through Thursday. Will continue to reach out and get her the assistance she needs.

## 2018-08-07 ENCOUNTER — Ambulatory Visit: Payer: Self-pay | Admitting: Family Medicine

## 2018-08-08 LAB — COMPLETE METABOLIC PANEL WITH GFR
AG RATIO: 1.3 (calc) (ref 1.0–2.5)
ALT: 15 U/L (ref 6–29)
AST: 19 U/L (ref 10–35)
Albumin: 3.9 g/dL (ref 3.6–5.1)
Alkaline phosphatase (APISO): 95 U/L (ref 37–153)
BUN / CREAT RATIO: 20 (calc) (ref 6–22)
BUN: 45 mg/dL — ABNORMAL HIGH (ref 7–25)
CO2: 24 mmol/L (ref 20–32)
Calcium: 9.1 mg/dL (ref 8.6–10.4)
Chloride: 108 mmol/L (ref 98–110)
Creat: 2.24 mg/dL — ABNORMAL HIGH (ref 0.50–0.99)
GFR, Est African American: 27 mL/min/{1.73_m2} — ABNORMAL LOW (ref >=60)
GFR, Est Non African American: 23 mL/min/{1.73_m2} — ABNORMAL LOW (ref >=60)
Globulin: 3 g/dL (calc) (ref 1.9–3.7)
Glucose, Bld: 91 mg/dL (ref 65–99)
Potassium: 4.5 mmol/L (ref 3.5–5.3)
Sodium: 139 mmol/L (ref 135–146)
Total Bilirubin: 0.4 mg/dL (ref 0.2–1.2)
Total Protein: 6.9 g/dL (ref 6.1–8.1)

## 2018-08-08 LAB — CBC
HCT: 35.3 % (ref 35.0–45.0)
Hemoglobin: 11.5 g/dL — ABNORMAL LOW (ref 11.7–15.5)
MCH: 30.7 pg (ref 27.0–33.0)
MCHC: 32.6 g/dL (ref 32.0–36.0)
MCV: 94.4 fL (ref 80.0–100.0)
MPV: 9.8 fL (ref 7.5–12.5)
PLATELETS: 288 10*3/uL (ref 140–400)
RBC: 3.74 10*6/uL — ABNORMAL LOW (ref 3.80–5.10)
RDW: 12 % (ref 11.0–15.0)
WBC: 5.6 10*3/uL (ref 3.8–10.8)

## 2018-08-08 LAB — LIPID PANEL
Cholesterol: 197 mg/dL (ref <200)
HDL: 78 mg/dL (ref >=50)
LDL Cholesterol (Calc): 102 mg/dL (calc) — ABNORMAL HIGH
Non-HDL Cholesterol (Calc): 119 mg/dL (calc) (ref <130)
Total CHOL/HDL Ratio: 2.5 (calc) (ref <5.0)
Triglycerides: 80 mg/dL (ref <150)

## 2018-08-08 LAB — HEMOGLOBIN A1C
Hgb A1c MFr Bld: 5.5 % of total Hgb (ref <5.7)
Mean Plasma Glucose: 111 (calc)
eAG (mmol/L): 6.2 (calc)

## 2018-08-08 LAB — VITAMIN D 25 HYDROXY (VIT D DEFICIENCY, FRACTURES): Vit D, 25-Hydroxy: 21 ng/mL — ABNORMAL LOW (ref 30–100)

## 2018-08-08 LAB — TSH: TSH: 1.49 mIU/L (ref 0.40–4.50)

## 2018-08-14 ENCOUNTER — Telehealth: Payer: Self-pay | Admitting: *Deleted

## 2018-08-14 MED ORDER — PHENTERMINE HCL 37.5 MG PO TABS: 37.5000 mg | ORAL_TABLET | Freq: Every day | ORAL | 2 refills | Status: DC

## 2018-08-14 NOTE — Patient Instructions (Signed)
P 

## 2018-08-14 NOTE — Telephone Encounter (Signed)
I spoke with pt ,. 3 months prescribed, has f/u at that time

## 2018-08-14 NOTE — Telephone Encounter (Signed)
Pt called requesting phentremine. She hasn't had it in awhile. This can be sent to [facility] in [place].

## 2018-11-28 ENCOUNTER — Other Ambulatory Visit: Payer: Self-pay

## 2018-11-28 ENCOUNTER — Ambulatory Visit (INDEPENDENT_AMBULATORY_CARE_PROVIDER_SITE_OTHER): Payer: Self-pay | Admitting: Family Medicine

## 2018-11-28 ENCOUNTER — Encounter: Payer: Self-pay | Admitting: Family Medicine

## 2018-11-28 VITALS — BP 114/64 | Ht 63.0 in | Wt 177.0 lb

## 2018-11-28 DIAGNOSIS — F17200 Nicotine dependence, unspecified, uncomplicated: Secondary | ICD-10-CM

## 2018-11-28 DIAGNOSIS — I10 Essential (primary) hypertension: Secondary | ICD-10-CM

## 2018-11-28 DIAGNOSIS — E663 Overweight: Secondary | ICD-10-CM

## 2018-11-28 DIAGNOSIS — Z1231 Encounter for screening mammogram for malignant neoplasm of breast: Secondary | ICD-10-CM

## 2018-11-28 MED ORDER — PHENTERMINE HCL 37.5 MG PO TABS: 37.5000 mg | ORAL_TABLET | Freq: Every day | ORAL | 1 refills | Status: DC

## 2018-11-28 NOTE — Progress Notes (Signed)
Virtual Visit via Telephone Note  I connected with Christina Williamson on 11/28/18 at  4:00 PM EDT by telephone and verified that I am speaking with the correct person using two identifiers.  Location: Patient: home Provider: office   I discussed the limitations, risks, security and privacy concerns of performing an evaluation and management service by telephone and the availability of in person appointments. I also discussed with the patient that there may be a patient responsible charge related to this service. The patient expressed understanding and agreed to proceed.   History of Present Illness: F/u chronic problems Doing well with phentermine and wants to continue Still smoking no quit date set Denies recent fever or chills. Denies sinus pressure, nasal congestion, ear pain or sore throat. Denies chest congestion, productive cough or wheezing. Denies chest pains, palpitations and leg swelling Denies abdominal pain, nausea, vomiting,diarrhea or constipation.   Denies dysuria, frequency, hesitancy or incontinence. Denies joint pain, swelling and limitation in mobility. Denies headaches, seizures, numbness, or tingling. Denies depression, anxiety or insomnia. Denies skin break down or rash.        Observations/Objective: BP 114/64   Ht 5\' 3"  (1.6 m)   Wt 177 lb (80.3 kg)   BMI 31.35 kg/m  from record Good communication with no confusion and intact memory. Alert and oriented x 3 No signs of respiratory distress during speech    Assessment and Plan:   Follow Up Instructions:    I discussed the assessment and treatment plan with the patient. The patient was provided an opportunity to ask questions and all were answered. The patient agreed with the plan and demonstrated an understanding of the instructions.   The patient was advised to call back or seek an in-person evaluation if the symptoms worsen or if the condition fails to improve as anticipated.  I provided 22  minutes of non-face-to-face time during this encounter.   Tula Nakayama, MD

## 2018-11-28 NOTE — Patient Instructions (Addendum)
Annual physical exam with MD in mid December call if you need me sooner  Appt for flu vaccine next week Monday  Please keep appt that wil be scheduled for your mammogram end October, very important to get this  Work on [employer] smoking, need to quit for health  Continue phentermine as before, weight loss goal of 5 pound in 2 months  It is important that you exercise regularly at least 30 minutes 5 times a week. If you develop chest pain, have severe difficulty breathing, or feel very tired, stop exercising immediately and seek medical attention  Think about what you will eat, plan ahead. Choose " clean, green, fresh or frozen" over canned, processed or packaged foods which are more sugary, salty and fatty. 70 to 75% of food eaten should be vegetables and fruit. Three meals at set times with snacks allowed between meals, but they must be fruit or vegetables. Aim to eat over a 12 hour period , example 7 am to 7 pm, and STOP after  your last meal of the day. Drink water,generally about 64 ounces per day, no other drink is as healthy. Fruit juice is best enjoyed in a healthy way, by EATING the fruit. Thanks for choosing [facility], we consider it a privelige to serve you.

## 2018-11-29 ENCOUNTER — Encounter: Payer: Self-pay | Admitting: Family Medicine

## 2018-11-29 NOTE — Assessment & Plan Note (Signed)
Asked:confirms currently smokes cigarettes Assess: Unwilling to quit but cutting back Advise: needs to QUIT to reduce risk of cancer, cardio and cerebrovascular disease Assist: counseled for 5 minutes and literature provided Arrange: follow up in 3 months  

## 2018-11-29 NOTE — Assessment & Plan Note (Signed)
Controlled, no change in medication DASH diet and commitment to daily physical activity for a minimum of 30 minutes discussed and encouraged, as a part of hypertension management. The importance of attaining a healthy weight is also discussed.  BP/Weight 11/28/2018 07/27/2018 02/06/2018 08/02/2017 02/01/2017 06/08/2016 32/99/2426  Systolic BP 834 196 222 979 892 119 417  Diastolic BP 64 64 64 78 78 84 84  Wt. (Lbs) 177 179 178.08 166 171 190 192  BMI 31.35 31.71 31.55 28.49 29.35 32.61 32.96

## 2018-11-29 NOTE — Assessment & Plan Note (Signed)
  Patient re-educated about  the importance of commitment to a  minimum of 150 minutes of exercise per week as able.  The importance of healthy food choices with portion control discussed, as well as eating regularly and within a 12 hour window most days. The need to choose "clean , green" food 50 to 75% of the time is discussed, as well as to make water the primary drink and set a goal of 64 ounces water daily.    Weight /BMI 11/28/2018 07/27/2018 02/06/2018  WEIGHT 177 lb 179 lb 178 lb 1.3 oz  HEIGHT 5\' 3"  5\' 3"  5\' 3"   BMI 31.35 kg/m2 31.71 kg/m2 31.55 kg/m2  continue phentermine as before with in office follow up

## 2018-12-04 ENCOUNTER — Ambulatory Visit: Payer: Self-pay

## 2018-12-18 ENCOUNTER — Ambulatory Visit: Payer: Self-pay

## 2018-12-26 ENCOUNTER — Ambulatory Visit (INDEPENDENT_AMBULATORY_CARE_PROVIDER_SITE_OTHER): Payer: Self-pay

## 2018-12-26 ENCOUNTER — Other Ambulatory Visit: Payer: Self-pay

## 2018-12-26 DIAGNOSIS — Z23 Encounter for immunization: Secondary | ICD-10-CM

## 2019-01-11 DIAGNOSIS — E559 Vitamin D deficiency, unspecified: Secondary | ICD-10-CM | POA: Insufficient documentation

## 2019-01-24 ENCOUNTER — Other Ambulatory Visit: Payer: Self-pay | Admitting: Family Medicine

## 2019-02-15 ENCOUNTER — Encounter: Payer: Self-pay | Admitting: Family Medicine

## 2019-03-27 ENCOUNTER — Other Ambulatory Visit: Payer: Self-pay

## 2019-03-27 ENCOUNTER — Encounter: Payer: Self-pay | Admitting: Family Medicine

## 2019-03-27 ENCOUNTER — Ambulatory Visit (INDEPENDENT_AMBULATORY_CARE_PROVIDER_SITE_OTHER): Payer: 59 | Admitting: Family Medicine

## 2019-03-27 VITALS — BP 120/78 | HR 79 | Temp 98.2°F | Resp 15 | Ht 63.0 in | Wt 183.1 lb

## 2019-03-27 DIAGNOSIS — E559 Vitamin D deficiency, unspecified: Secondary | ICD-10-CM

## 2019-03-27 DIAGNOSIS — F17200 Nicotine dependence, unspecified, uncomplicated: Secondary | ICD-10-CM

## 2019-03-27 DIAGNOSIS — Z0000 Encounter for general adult medical examination without abnormal findings: Secondary | ICD-10-CM

## 2019-03-27 DIAGNOSIS — I10 Essential (primary) hypertension: Secondary | ICD-10-CM | POA: Diagnosis not present

## 2019-03-27 NOTE — Patient Instructions (Addendum)
F/un in 6 months, call, if you need me before  Please schedule mammogram at CHECKOUT   Non Fasting CBC, chem 7  and EGFr,   And vit D in next 1 to 3 weeks  Weight loss goal of 10 pounds in 6, months, continue half phentermine daily  It is important that you exercise regularly at least 30 minutes 5 times a week. If you develop chest pain, have severe difficulty breathing, or feel very tired, stop exercising immediately and seek medical attention    Think about what you will eat, plan ahead. Choose " clean, green, fresh or frozen" over canned, processed or packaged foods which are more sugary, salty and fatty. 70 to 75% of food eaten should be vegetables and fruit. Three meals at set times with snacks allowed between meals, but they must be fruit or vegetables. Aim to eat over a 12 hour period , example 7 am to 7 pm, and STOP after  your last meal of the day. Drink water,generally about 64 ounces per day, no other drink is as healthy. Fruit juice is best enjoyed in a healthy way, by EATING the fruit.  Thanks for choosing [facility], we consider it a privelige to serve you.

## 2019-03-27 NOTE — Progress Notes (Signed)
    Christina Williamson     MRN: [id number]      DOB: 08/17/1955  HPI: Patient is in for annual physical exam. No other health concerns are expressed or addressed at the visit. Recent labs, if available are reviewed. Immunization is reviewed , she is holding on tdApat this time   PE:  BP 120/78   Pulse 79   Temp 98.2 F (36.8 C) (Temporal)   Resp 15   Ht 5\' 3"  (1.6 m)   Wt 183 lb 1.9 oz (83.1 kg)   SpO2 95%   BMI 32.44 kg/m   Pleasant  female, alert and oriented x 3, in no cardio-pulmonary distress. Afebrile. HEENT No facial trauma or asymetry. Sinuses non tender.  Extra occullar muscles intact.. External ears normal, . Neck: supple, no adenopathy,JVD or thyromegaly.No bruits.  Chest: Clear to ascultation bilaterally.No crackles or wheezes. Non tender to palpation  Breast: No asymetry,no masses or lumps. No tenderness. No nipple discharge or inversion. No axillary or supraclavicular adenopathy  Cardiovascular system; Heart sounds normal,  S1 and  S2 ,no S3.  No murmur, or thrill. Apical beat not displaced Peripheral pulses normal.  Abdomen: Soft, non tender, no organomegaly or masses. No bruits. Bowel sounds normal. No guarding, tenderness or rebound.     Musculoskeletal exam: Full ROM of spine, hips , shoulders and knees. No deformity ,swelling or crepitus noted. No muscle wasting or atrophy.   Neurologic: Cranial nerves 2 to 12 intact. Power, tone ,sensation and reflexes normal throughout. No disturbance in gait. No tremor.  Skin: Intact, no ulceration, erythema , scaling or rash noted. Pigmentation normal throughout  Psych; Normal mood and affect. Judgement and concentration normal   Assessment & Plan:  Annual physical exam Annual exam as documented. Counseling done  re healthy lifestyle involving commitment to 150 minutes exercise per week, heart healthy diet, and attaining healthy weight.The importance of adequate sleep also  discussed. Regular seat belt use and home safety, is also discussed. Changes in health habits are decided on by the patient with goals and time frames  set for achieving them. Immunization and cancer screening needs are specifically addressed at this visit.   NICOTINE ADDICTION Asked:confirms currently smokes cigarettes Assess: Unwilling to quit but cutting back Advise: needs to QUIT to reduce risk of cancer, cardio and cerebrovascular disease Assist: counseled for 5 minutes and literature provided Arrange: follow up in 3 months

## 2019-03-27 NOTE — Assessment & Plan Note (Signed)

## 2019-03-31 ENCOUNTER — Encounter: Payer: Self-pay | Admitting: Family Medicine

## 2019-03-31 NOTE — Assessment & Plan Note (Signed)
Asked:confirms currently smokes cigarettes Assess: Unwilling to quit but cutting back Advise: needs to QUIT to reduce risk of cancer, cardio and cerebrovascular disease Assist: counseled for 5 minutes and literature provided Arrange: follow up in 3 months  

## 2019-04-09 ENCOUNTER — Ambulatory Visit (HOSPITAL_COMMUNITY): Payer: 59

## 2019-04-30 ENCOUNTER — Other Ambulatory Visit: Payer: Self-pay | Admitting: Family Medicine

## 2019-04-30 NOTE — Telephone Encounter (Signed)
Pt requesting refill

## 2019-05-01 ENCOUNTER — Other Ambulatory Visit: Payer: Self-pay | Admitting: Family Medicine

## 2019-05-01 MED ORDER — PHENTERMINE HCL 37.5 MG PO TABS: 37.5000 mg | ORAL_TABLET | Freq: Every day | ORAL | 1 refills | Status: DC

## 2019-05-12 ENCOUNTER — Ambulatory Visit: Payer: 59 | Attending: Internal Medicine

## 2019-05-12 DIAGNOSIS — Z23 Encounter for immunization: Secondary | ICD-10-CM

## 2019-05-12 NOTE — Progress Notes (Signed)
   Covid-19 Vaccination Clinic  Name:  LAVORA BRISBON    MRN: [id number] DOB: 01-Jul-1955  05/12/2019  Ms. Klosinski was observed post Covid-19 immunization for 15 minutes without incident. She was provided with Vaccine Information Sheet and instruction to access the V-Safe system.   Ms. Wycoff was instructed to call 911 with any severe reactions post vaccine: Marland Kitchen Difficulty breathing  . Swelling of face and throat  . A fast heartbeat  . A bad rash all over body  . Dizziness and weakness   Immunizations Administered    Name Date Dose VIS Date Route   Moderna COVID-19 Vaccine 05/12/2019 10:38 AM 0.5 mL 01/30/2019 Intramuscular   Manufacturer: Moderna   Lot: [id number]   [place]: [id number]

## 2019-06-13 ENCOUNTER — Ambulatory Visit: Payer: 59 | Attending: Internal Medicine

## 2019-06-13 DIAGNOSIS — Z23 Encounter for immunization: Secondary | ICD-10-CM

## 2019-06-13 NOTE — Progress Notes (Signed)
   Covid-19 Vaccination Clinic  Name:  MINAL STULLER    MRN: [id number] DOB: Aug 30, 1955  06/13/2019  Ms. Manera was observed post Covid-19 immunization for 15 minutes without incident. She was provided with Vaccine Information Sheet and instruction to access the V-Safe system.   Ms. Kilcrease was instructed to call 911 with any severe reactions post vaccine: Marland Kitchen Difficulty breathing  . Swelling of face and throat  . A fast heartbeat  . A bad rash all over body  . Dizziness and weakness   Immunizations Administered    Name Date Dose VIS Date Route   Moderna COVID-19 Vaccine 06/13/2019  8:35 AM 0.5 mL 01/30/2019 Intramuscular   Manufacturer: Moderna   Lot: [id number]   [place]: [id number]

## 2019-09-19 ENCOUNTER — Other Ambulatory Visit: Payer: Self-pay | Admitting: Family Medicine

## 2019-09-24 ENCOUNTER — Ambulatory Visit: Payer: 59 | Admitting: Family Medicine

## 2019-09-25 ENCOUNTER — Other Ambulatory Visit: Payer: Self-pay | Admitting: *Deleted

## 2019-09-25 DIAGNOSIS — I10 Essential (primary) hypertension: Secondary | ICD-10-CM

## 2019-09-25 DIAGNOSIS — E8881 Metabolic syndrome: Secondary | ICD-10-CM

## 2019-09-26 LAB — BMP8+EGFR
BUN/Creatinine Ratio: 18 (ref 12–28)
BUN: 41 mg/dL — ABNORMAL HIGH (ref 8–27)
CO2: 21 mmol/L (ref 20–29)
Calcium: 9.4 mg/dL (ref 8.7–10.3)
Chloride: 106 mmol/L (ref 96–106)
Creatinine, Ser: 2.34 mg/dL — ABNORMAL HIGH (ref 0.57–1.00)
GFR calc Af Amer: 25 mL/min/{1.73_m2} — ABNORMAL LOW (ref >59)
GFR calc non Af Amer: 22 mL/min/{1.73_m2} — ABNORMAL LOW (ref >59)
Glucose: 92 mg/dL (ref 65–99)
Potassium: 4.3 mmol/L (ref 3.5–5.2)
Sodium: 141 mmol/L (ref 134–144)

## 2019-09-26 LAB — VITAMIN D 25 HYDROXY (VIT D DEFICIENCY, FRACTURES): Vit D, 25-Hydroxy: 21 ng/mL — ABNORMAL LOW (ref 30.0–100.0)

## 2019-09-26 LAB — CBC
Hematocrit: 33.8 % — ABNORMAL LOW (ref 34.0–46.6)
Hemoglobin: 11.3 g/dL (ref 11.1–15.9)
MCH: 31.1 pg (ref 26.6–33.0)
MCHC: 33.4 g/dL (ref 31.5–35.7)
MCV: 93 fL (ref 79–97)
Platelets: 277 10*3/uL (ref 150–450)
RBC: 3.63 x10E6/uL — ABNORMAL LOW (ref 3.77–5.28)
RDW: 12.1 % (ref 11.7–15.4)
WBC: 5.9 10*3/uL (ref 3.4–10.8)

## 2019-10-01 ENCOUNTER — Ambulatory Visit (INDEPENDENT_AMBULATORY_CARE_PROVIDER_SITE_OTHER): Payer: 59 | Admitting: Family Medicine

## 2019-10-01 ENCOUNTER — Other Ambulatory Visit: Payer: Self-pay

## 2019-10-01 ENCOUNTER — Other Ambulatory Visit: Payer: Self-pay | Admitting: *Deleted

## 2019-10-01 ENCOUNTER — Encounter: Payer: Self-pay | Admitting: Family Medicine

## 2019-10-01 VITALS — BP 129/87 | HR 76 | Temp 97.6°F | Resp 18 | Ht 63.0 in | Wt 181.0 lb

## 2019-10-01 DIAGNOSIS — R5382 Chronic fatigue, unspecified: Secondary | ICD-10-CM | POA: Diagnosis not present

## 2019-10-01 DIAGNOSIS — I10 Essential (primary) hypertension: Secondary | ICD-10-CM

## 2019-10-01 DIAGNOSIS — N184 Chronic kidney disease, stage 4 (severe): Secondary | ICD-10-CM | POA: Diagnosis not present

## 2019-10-01 DIAGNOSIS — E669 Obesity, unspecified: Secondary | ICD-10-CM

## 2019-10-01 DIAGNOSIS — E66811 Obesity, class 1: Secondary | ICD-10-CM

## 2019-10-01 DIAGNOSIS — F17200 Nicotine dependence, unspecified, uncomplicated: Secondary | ICD-10-CM | POA: Diagnosis not present

## 2019-10-01 MED ORDER — TRIAMTERENE-HCTZ 75-50 MG PO TABS: 1.0000 | ORAL_TABLET | Freq: Every day | ORAL | 1 refills | Status: DC

## 2019-10-01 MED ORDER — ERGOCALCIFEROL 1.25 MG (50000 UT) PO CAPS: 50000.0000 [IU] | ORAL_CAPSULE | ORAL | 1 refills | Status: DC

## 2019-10-01 MED ORDER — BENAZEPRIL HCL 40 MG PO TABS: 40.0000 mg | ORAL_TABLET | Freq: Every day | ORAL | 1 refills | Status: DC

## 2019-10-01 MED ORDER — AMLODIPINE BESYLATE 10 MG PO TABS: 10.0000 mg | ORAL_TABLET | Freq: Every day | ORAL | 1 refills | Status: DC

## 2019-10-01 NOTE — Patient Instructions (Addendum)
Annual physical exam in office with MD in Jan when due , call if you need me sooner  Mammogram to be ordered at checkout  Additional blood tests are ordered, thyroid function , cholesterol and liver function from recent labs , we will contact you with results  Your vit D is low, you  need to take once wekly vit D for the next 6 months  Now smoking 3 cigs/ day still, please commit o QUITTING to help to protect your health  You are referred to Pulmonary Specialist  re chronic fatigue  You are referred to Nephrologist re chronic kidney disease  It is important that you exercise regularly at least 30 minutes 5 times a week. If you develop chest pain, have severe difficulty breathing, or feel very tired, stop exercising immediately and seek medical attention   Think about what you will eat, plan ahead. Choose " clean, green, fresh or frozen" over canned, processed or packaged foods which are more sugary, salty and fatty. 70 to 75% of food eaten should be vegetables and fruit. Three meals at set times with snacks allowed between meals, but they must be fruit or vegetables. Aim to eat over a 12 hour period , example 7 am to 7 pm, and STOP after  your last meal of the day. Drink water,generally about 64 ounces per day, no other drink is as healthy. Fruit juice is best enjoyed in a healthy way, by EATING the fruit.

## 2019-10-01 NOTE — Assessment & Plan Note (Addendum)
Needs re eval by Nephrology

## 2019-10-06 ENCOUNTER — Encounter: Payer: Self-pay | Admitting: Family Medicine

## 2019-10-06 DIAGNOSIS — E663 Overweight: Secondary | ICD-10-CM | POA: Insufficient documentation

## 2019-10-06 DIAGNOSIS — E669 Obesity, unspecified: Secondary | ICD-10-CM | POA: Insufficient documentation

## 2019-10-06 NOTE — Assessment & Plan Note (Signed)
  Patient re-educated about  the importance of commitment to a  minimum of 150 minutes of exercise per week as able.  The importance of healthy food choices with portion control discussed, as well as eating regularly and within a 12 hour window most days. The need to choose "clean , green" food 50 to 75% of the time is discussed, as well as to make water the primary drink and set a goal of 64 ounces water daily.    Weight /BMI 10/01/2019 03/27/2019 11/28/2018  WEIGHT 181 lb 183 lb 1.9 oz 177 lb  HEIGHT 5\' 3"  5\' 3"  5\' 3"   BMI 32.06 kg/m2 32.44 kg/m2 31.35 kg/m2

## 2019-10-06 NOTE — Progress Notes (Signed)
Christina Williamson     MRN: [id number]      DOB: 1955/04/08   HPI Christina Williamson is here for follow up and re-evaluation of chronic medical conditions, medication management and review of any available recent lab and radiology data.  Preventive health is updated, specifically  Cancer screening and Immunization.   Questions or concerns regarding consultations or procedures which the PT has had in the interim are  addressed. The PT denies any adverse reactions to current medications since the last visit.  C/o chronic fatigue, excess day time sleepiness , awakens not feeling rested Still smoking 3 ciggs/ day , wants to quit  ROS Denies recent fever or chills. Denies sinus pressure, nasal congestion, ear pain or sore throat. Denies chest congestion, productive cough or wheezing. Denies chest pains, palpitations and leg swelling Denies abdominal pain, nausea, vomiting,diarrhea or constipation.   Denies dysuria, frequency, hesitancy or incontinence. Denies joint pain, swelling and limitation in mobility. Denies headaches, seizures, numbness, or tingling. Denies depression, anxiety or insomnia. Denies skin break down or rash.   PE  BP (!) 129/87 (BP Location: Left Arm, Patient Position: Sitting, Cuff Size: Normal)   Pulse 76   Temp 97.6 F (36.4 C) (Oral)   Resp 18   Ht 5\' 3"  (1.6 m)   Wt 181 lb (82.1 kg)   SpO2 99%   BMI 32.06 kg/m   Patient alert and oriented and in no cardiopulmonary distress.  HEENT: No facial asymmetry, EOMI,     Neck supple .  Chest: Clear to auscultation bilaterally.  CVS: S1, S2 no murmurs, no S3.Regular rate.  ABD: Soft non tender.   Ext: No edema  MS: Adequate ROM spine, shoulders, hips and knees.  Skin: Intact, no ulcerations or rash noted.  Psych: Good eye contact, normal affect. Memory intact not anxious or depressed appearing.  CNS: CN 2-12 intact, power,  normal throughout.no focal deficits noted.   Assessment & Plan  CKD (chronic  kidney disease) stage 4, GFR 15-29 ml/min (HCC) Needs re eval by Nephrology  Fatigue Chronic fatigue and daytime sleepiness, may benefit from stimulant, refer to pulnmonary for further eval, r/o sleep apnea and treat  Essential hypertension Controlled, no change in medication DASH diet and commitment to daily physical activity for a minimum of 30 minutes discussed and encouraged, as a part of hypertension management. The importance of attaining a healthy weight is also discussed.  BP/Weight 10/01/2019 03/27/2019 11/28/2018 07/27/2018 02/06/2018 08/02/2017 24/05/100  Systolic BP 725 366 440 347 425 956 387  Diastolic BP 87 78 64 64 64 78 78  Wt. (Lbs) 181 183.12 177 179 178.08 166 171  BMI 32.06 32.44 31.35 31.71 31.55 28.49 29.35       NICOTINE ADDICTION Asked:confirms currently smokes cigarettes 3/day Assess: Unwilling to quit but cutting back Advise: needs to QUIT to reduce risk of cancer, cardio and cerebrovascular disease Assist: counseled for 5 minutes and literature provided Arrange: follow up in 3 months   Obesity (BMI 30.0-34.9)  Patient re-educated about  the importance of commitment to a  minimum of 150 minutes of exercise per week as able.  The importance of healthy food choices with portion control discussed, as well as eating regularly and within a 12 hour window most days. The need to choose "clean , green" food 50 to 75% of the time is discussed, as well as to make water the primary drink and set a goal of 64 ounces water daily.    Weight /BMI  10/01/2019 03/27/2019 11/28/2018  WEIGHT 181 lb 183 lb 1.9 oz 177 lb  HEIGHT 5\' 3"  5\' 3"  5\' 3"   BMI 32.06 kg/m2 32.44 kg/m2 31.35 kg/m2

## 2019-10-06 NOTE — Assessment & Plan Note (Signed)
Asked:confirms currently smokes cigarettes 3/day Assess: Unwilling to quit but cutting back Advise: needs to QUIT to reduce risk of cancer, cardio and cerebrovascular disease Assist: counseled for 5 minutes and literature provided Arrange: follow up in 3 months

## 2019-10-06 NOTE — Assessment & Plan Note (Signed)
Chronic fatigue and daytime sleepiness, may benefit from stimulant, refer to pulnmonary for further eval, r/o sleep apnea and treat

## 2019-10-06 NOTE — Assessment & Plan Note (Signed)
Controlled, no change in medication DASH diet and commitment to daily physical activity for a minimum of 30 minutes discussed and encouraged, as a part of hypertension management. The importance of attaining a healthy weight is also discussed.  BP/Weight 10/01/2019 03/27/2019 11/28/2018 07/27/2018 02/06/2018 08/02/2017 83/04/3830  Systolic BP 919 166 060 045 997 741 423  Diastolic BP 87 78 64 64 64 78 78  Wt. (Lbs) 181 183.12 177 179 178.08 166 171  BMI 32.06 32.44 31.35 31.71 31.55 28.49 29.35

## 2019-10-24 LAB — LIPID PANEL W/O CHOL/HDL RATIO
Cholesterol, Total: 231 mg/dL — ABNORMAL HIGH (ref 100–199)
HDL: 72 mg/dL (ref >39)
LDL Chol Calc (NIH): 137 mg/dL — ABNORMAL HIGH (ref 0–99)
Triglycerides: 127 mg/dL (ref 0–149)
VLDL Cholesterol Cal: 22 mg/dL (ref 5–40)

## 2019-10-24 LAB — SPECIMEN STATUS REPORT

## 2019-10-24 LAB — HEPATIC FUNCTION PANEL (6)
ALT: 18 IU/L (ref 0–32)
AST: 36 IU/L (ref 0–40)
Albumin: 4.1 g/dL (ref 3.8–4.8)
Alkaline Phosphatase: 119 IU/L (ref 48–121)
Bilirubin Total: 0.3 mg/dL (ref 0.0–1.2)
Bilirubin, Direct: 0.04 mg/dL (ref 0.00–0.40)

## 2019-10-24 LAB — TSH: TSH: 2.23 u[IU]/mL (ref 0.450–4.500)

## 2019-11-06 ENCOUNTER — Other Ambulatory Visit: Payer: Self-pay

## 2019-11-06 ENCOUNTER — Ambulatory Visit (INDEPENDENT_AMBULATORY_CARE_PROVIDER_SITE_OTHER): Payer: 59 | Admitting: Family Medicine

## 2019-11-06 ENCOUNTER — Encounter: Payer: Self-pay | Admitting: Family Medicine

## 2019-11-06 VITALS — BP 126/84 | HR 72 | Resp 16 | Ht 63.0 in | Wt 186.0 lb

## 2019-11-06 DIAGNOSIS — Z23 Encounter for immunization: Secondary | ICD-10-CM | POA: Diagnosis not present

## 2019-11-06 DIAGNOSIS — M79672 Pain in left foot: Secondary | ICD-10-CM

## 2019-11-06 DIAGNOSIS — E669 Obesity, unspecified: Secondary | ICD-10-CM

## 2019-11-06 DIAGNOSIS — I10 Essential (primary) hypertension: Secondary | ICD-10-CM

## 2019-11-06 MED ORDER — METHYLPREDNISOLONE ACETATE 80 MG/ML IJ SUSP
80.0000 mg | Freq: Once | INTRAMUSCULAR | Status: AC
  Administered 2019-11-06: 80 mg via INTRAMUSCULAR

## 2019-11-06 MED ORDER — FAMOTIDINE 20 MG PO TABS: ORAL_TABLET | ORAL | 0 refills | Status: DC

## 2019-11-06 MED ORDER — PREDNISONE 10 MG PO TABS
ORAL_TABLET | ORAL | 0 refills | Status: DC
Start: 2019-11-06 — End: 2020-05-08

## 2019-11-06 NOTE — Assessment & Plan Note (Signed)
°  Patient re-educated about  the importance of commitment to a  minimum of 150 minutes of exercise per week as able.  The importance of healthy food choices with portion control discussed, as well as eating regularly and within a 12 hour window most days. The need to choose "clean , green" food 50 to 75% of the time is discussed, as well as to make water the primary drink and set a goal of 64 ounces water daily.    Weight /BMI 11/06/2019 10/01/2019 03/27/2019  WEIGHT 186 lb 181 lb 183 lb 1.9 oz  HEIGHT 5\' 3"  5\' 3"  5\' 3"   BMI 32.95 kg/m2 32.06 kg/m2 32.44 kg/m2

## 2019-11-06 NOTE — Progress Notes (Signed)
   Christina Williamson     MRN: [id number]      DOB: 02-17-1956   HPI Christina Williamson is here with a 2 week h/o disabling left heel pain, increasingly difficult to stand on the foot. No recent trauma Has had plantar fascitis in the past  ROS Denies recent fever or chills. Denies sinus pressure, nasal congestion, ear pain or sore throat. Denies chest congestion, productive cough or wheezing. Denies chest pains, palpitations and leg swelling Denies abdominal pain, nausea, vomiting,diarrhea or constipation.   Denies dysuria, frequency, hesitancy or incontinence. Denies joint pain, swelling and limitation in mobility. Denies headaches, seizures, numbness, or tingling. Denies depression, anxiety or insomnia. Denies skin break down or rash.   PE  BP 126/84   Pulse 72   Resp 16   Ht 5\' 3"  (1.6 m)   Wt 186 lb (84.4 kg)   SpO2 97%   BMI 32.95 kg/m   Patient alert and oriented and in no cardiopulmonary distress.  HEENT: No facial asymmetry, EOMI,     Neck supple .  Chest: Clear to auscultation bilaterally.  CVS: S1, S2 no murmurs, no S3.Regular rate.  ABD: Soft non tender.   Ext: No edema  MS: Adequate ROM spine, shoulders, hips and knees.Tender over left heel, no warmth or erythema  Skin: Intact, no ulcerations or rash noted.  Psych: Good eye contact, normal affect. Memory intact not anxious or depressed appearing.  CNS: CN 2-12 intact, power,  normal throughout.no focal deficits noted.   Assessment & Plan  Pain of left heel Acute onset x 2 months, depo medrol  80 mg IM , followed by short course of oral prednisone, refer to podiatry if no relief, needs to keep off of feet as much as possible for symptom relief and leaving time for pain to subside  Essential hypertension Controlled, no change in medication DASH diet and commitment to daily physical activity for a minimum of 30 minutes discussed and encouraged, as a part of hypertension management. The importance of  attaining a healthy weight is also discussed.  BP/Weight 11/06/2019 10/01/2019 03/27/2019 11/28/2018 07/27/2018 50/04/8880 8/0/0349  Systolic BP 179 150 569 794 801 655 374  Diastolic BP 84 87 78 64 64 64 78  Wt. (Lbs) 186 181 183.12 177 179 178.08 166  BMI 32.95 32.06 32.44 31.35 31.71 31.55 28.49       Obesity (BMI 30.0-34.9)  Patient re-educated about  the importance of commitment to a  minimum of 150 minutes of exercise per week as able.  The importance of healthy food choices with portion control discussed, as well as eating regularly and within a 12 hour window most days. The need to choose "clean , green" food 50 to 75% of the time is discussed, as well as to make water the primary drink and set a goal of 64 ounces water daily.    Weight /BMI 11/06/2019 10/01/2019 03/27/2019  WEIGHT 186 lb 181 lb 183 lb 1.9 oz  HEIGHT 5\' 3"  5\' 3"  5\' 3"   BMI 32.95 kg/m2 32.06 kg/m2 32.44 kg/m2

## 2019-11-06 NOTE — Patient Instructions (Signed)
F/U as before , call if you need me sooner  Flu vaccine today  Depomedrol  80 mg MM in office today for heeel apin followed by short couse of prednisone and also pepcid to protect your stomach  Hope you feel better soon

## 2019-11-06 NOTE — Assessment & Plan Note (Signed)
Acute onset x 2 months, depo medrol  80 mg IM , followed by short course of oral prednisone, refer to podiatry if no relief, needs to keep off of feet as much as possible for symptom relief and leaving time for pain to subside

## 2019-11-06 NOTE — Assessment & Plan Note (Signed)
Controlled, no change in medication DASH diet and commitment to daily physical activity for a minimum of 30 minutes discussed and encouraged, as a part of hypertension management. The importance of attaining a healthy weight is also discussed.  BP/Weight 11/06/2019 10/01/2019 03/27/2019 11/28/2018 07/27/2018 88/04/5842 08/04/2074  Systolic BP 191 550 [phone]  Diastolic BP 84 87 78 64 64 64 78  Wt. (Lbs) 186 181 183.12 177 179 178.08 166  BMI 32.95 32.06 32.44 31.35 31.71 31.55 28.49

## 2019-11-27 ENCOUNTER — Institutional Professional Consult (permissible substitution): Payer: 59 | Admitting: Pulmonary Disease

## 2020-03-04 ENCOUNTER — Encounter: Payer: 59 | Admitting: Family Medicine

## 2020-05-08 ENCOUNTER — Other Ambulatory Visit: Payer: Self-pay | Admitting: Family Medicine

## 2020-05-08 ENCOUNTER — Encounter: Payer: Self-pay | Admitting: Family Medicine

## 2020-05-08 ENCOUNTER — Ambulatory Visit (INDEPENDENT_AMBULATORY_CARE_PROVIDER_SITE_OTHER): Payer: 59 | Admitting: Family Medicine

## 2020-05-08 ENCOUNTER — Other Ambulatory Visit: Payer: Self-pay

## 2020-05-08 VITALS — BP 122/74 | HR 70 | Resp 16 | Ht 63.0 in | Wt 183.0 lb

## 2020-05-08 DIAGNOSIS — I10 Essential (primary) hypertension: Secondary | ICD-10-CM | POA: Diagnosis not present

## 2020-05-08 DIAGNOSIS — Z0001 Encounter for general adult medical examination with abnormal findings: Secondary | ICD-10-CM

## 2020-05-08 DIAGNOSIS — Z1231 Encounter for screening mammogram for malignant neoplasm of breast: Secondary | ICD-10-CM | POA: Diagnosis not present

## 2020-05-08 DIAGNOSIS — E559 Vitamin D deficiency, unspecified: Secondary | ICD-10-CM

## 2020-05-08 DIAGNOSIS — E669 Obesity, unspecified: Secondary | ICD-10-CM

## 2020-05-08 DIAGNOSIS — E66811 Obesity, class 1: Secondary | ICD-10-CM

## 2020-05-08 DIAGNOSIS — Z23 Encounter for immunization: Secondary | ICD-10-CM

## 2020-05-08 DIAGNOSIS — F17200 Nicotine dependence, unspecified, uncomplicated: Secondary | ICD-10-CM

## 2020-05-08 DIAGNOSIS — Z0000 Encounter for general adult medical examination without abnormal findings: Secondary | ICD-10-CM

## 2020-05-08 DIAGNOSIS — E785 Hyperlipidemia, unspecified: Secondary | ICD-10-CM

## 2020-05-08 MED ORDER — NICODERM CQ 7 MG/24HR TD PT24: 7.0000 mg | MEDICATED_PATCH | Freq: Every day | TRANSDERMAL | 0 refills | Status: DC

## 2020-05-08 MED ORDER — PHENTERMINE HCL 37.5 MG PO TABS: ORAL_TABLET | ORAL | 1 refills | Status: DC

## 2020-05-08 MED ORDER — NICODERM CQ 14 MG/24HR TD PT24: 14.0000 mg | MEDICATED_PATCH | Freq: Every day | TRANSDERMAL | 0 refills | Status: DC

## 2020-05-08 NOTE — Assessment & Plan Note (Signed)

## 2020-05-08 NOTE — Assessment & Plan Note (Signed)
Asked:confirms currently smokes cigarettes Assess: willing to set a quit date,and will start nicoderm patches Advise: needs to QUIT to reduce risk of cancer, cardio and cerebrovascular disease Assist: counseled for 5 minutes and literature provided Arrange: follow up in 2 to 4 months Start screening chest scans due to over 20 pack year history

## 2020-05-08 NOTE — Patient Instructions (Addendum)
F/U in 15 weeks, re evaluate weight and smoking, call if you need me sooner  TdAp today  Lipid panel , Vit D today  Mammogram to be schedule at checkout  You are being referred for chest scan for screening for lung cancer due to over 20 pack year history, need to quit, we will call with appointment  New is nicoderm patch use instead of cigarettes, should stop those in next 56 days  Take phentermine half tablet once daily to help weight loss  It is important that you exercise regularly at least 30 minutes 5 times a week. If you develop chest pain, have severe difficulty breathing, or feel very tired, stop exercising immediately and seek medical attention   Thanks for choosing [facility], we consider it a privelige to serve you.    Steps to Quit Smoking Smoking tobacco is the leading cause of preventable death. It can affect almost every organ in the body. Smoking puts you and people around you at risk for many serious, long-lasting (chronic) diseases. Quitting smoking can be hard, but it is one of the best things that you can do for your health. It is never too late to quit. How do I get ready to quit? When you decide to quit smoking, make a plan to help you succeed. Before you quit:  Pick a date to quit. Set a date within the next 2 weeks to give you time to prepare.  Write down the reasons why you are quitting. Keep this list in places where you will see it often.  Tell your family, friends, and co-workers that you are quitting. Their support is important.  Talk with your doctor about the choices that may help you quit.  Find out if your health insurance will pay for these treatments.  Know the people, places, things, and activities that make you want to smoke (triggers). Avoid them. What first steps can I take to quit smoking?  Throw away all cigarettes at home, at work, and in your car.  Throw away the things that you use when you smoke, such as ashtrays and  lighters.  Clean your car. Make sure to empty the ashtray.  Clean your home, including curtains and carpets. What can I do to help me quit smoking? Talk with your doctor about taking medicines and seeing a counselor at the same time. You are more likely to succeed when you do both.  If you are pregnant or breastfeeding, talk with your doctor about counseling or other ways to quit smoking. Do not take medicine to help you quit smoking unless your doctor tells you to do so. To quit smoking: Quit right away  Quit smoking totally, instead of slowly cutting back on how much you smoke over a period of time.  Go to counseling. You are more likely to quit if you go to counseling sessions regularly. Take medicine You may take medicines to help you quit. Some medicines need a prescription, and some you can buy over-the-counter. Some medicines may contain a drug called nicotine to replace the nicotine in cigarettes. Medicines may:  Help you to stop having the desire to smoke (cravings).  Help to stop the problems that come when you stop smoking (withdrawal symptoms). Your doctor may ask you to use:  Nicotine patches, gum, or lozenges.  Nicotine inhalers or sprays.  Non-nicotine medicine that is taken by mouth. Find resources Find resources and other ways to help you quit smoking and remain smoke-free after you  quit. These resources are most helpful when you use them often. They include:  Online chats with a Social worker.  Phone quitlines.  Printed Furniture conservator/restorer.  Support groups or group counseling.  Text messaging programs.  Mobile phone apps. Use apps on your mobile phone or tablet that can help you stick to your quit plan. There are many free apps for mobile phones and tablets as well as websites. Examples include Quit Guide from the [employer] and smokefree.gov   What things can I do to make it easier to quit?  Talk to your family and friends. Ask them to support and encourage  you.  Call a phone quitline (1-800-QUIT-NOW), reach out to support groups, or work with a Social worker.  Ask people who smoke to not smoke around you.  Avoid places that make you want to smoke, such as: ? Bars. ? Parties. ? Smoke-break areas at work.  Spend time with people who do not smoke.  Lower the stress in your life. Stress can make you want to smoke. Try these things to help your stress: ? Getting regular exercise. ? Doing deep-breathing exercises. ? Doing yoga. ? Meditating. ? Doing a body scan. To do this, close your eyes, focus on one area of your body at a time from head to toe. Notice which parts of your body are tense. Try to relax the muscles in those areas.   How will I feel when I quit smoking? Day 1 to 3 weeks Within the first 24 hours, you may start to have some problems that come from quitting tobacco. These problems are very bad 2-3 days after you quit, but they do not often last for more than 2-3 weeks. You may get these symptoms:  Mood swings.  Feeling restless, nervous, angry, or annoyed.  Trouble concentrating.  Dizziness.  Strong desire for high-sugar foods and nicotine.  Weight gain.  Trouble pooping (constipation).  Feeling like you may vomit (nausea).  Coughing or a sore throat.  Changes in how the medicines that you take for other issues work in your body.  Depression.  Trouble sleeping (insomnia). Week 3 and afterward After the first 2-3 weeks of quitting, you may start to notice more positive results, such as:  Better sense of smell and taste.  Less coughing and sore throat.  Slower heart rate.  Lower blood pressure.  Clearer skin.  Better breathing.  Fewer sick days. Quitting smoking can be hard. Do not give up if you fail the first time. Some people need to try a few times before they succeed. Do your best to stick to your quit plan, and talk with your doctor if you have any questions or concerns. Summary  Smoking tobacco  is the leading cause of preventable death. Quitting smoking can be hard, but it is one of the best things that you can do for your health.  When you decide to quit smoking, make a plan to help you succeed.  Quit smoking right away, not slowly over a period of time.  When you start quitting, seek help from your doctor, family, or friends. This information is not intended to replace advice given to you by your health care provider. Make sure you discuss any questions you have with your health care provider. Document Revised: 11/10/2018 Document Reviewed: 05/06/2018 Elsevier Patient Education  [place].

## 2020-05-08 NOTE — Progress Notes (Signed)
    Christina Williamson     MRN: [id number]      DOB: [birth year]  HPI: Patient is in for annual physical exam. obesiity and nicotine used are addressed Recent labs,  are reviewed. Immunization is reviewed , and  updated    PE: BP 122/74   Pulse 70   Resp 16   Ht 5\' 3"  (1.6 m)   Wt 183 lb (83 kg)   SpO2 99%   BMI 32.42 kg/m   Pleasant  female, alert and oriented x 3, in no cardio-pulmonary distress. Afebrile. HEENT No facial trauma or asymetry. Sinuses non tender.  Extra occullar muscles intact.. External ears normal, . Neck: supple, no adenopathy,JVD or thyromegaly.No bruits.  Chest: Clear to ascultation bilaterally.No crackles or wheezes. Non tender to palpation  Breast: No asymetry,no masses or lumps. No tenderness. No nipple discharge or inversion. No axillary or supraclavicular adenopathy  Cardiovascular system; Heart sounds normal,  S1 and  S2 ,no S3.  No murmur, or thrill. Apical beat not displaced Peripheral pulses normal.  Abdomen: Soft, non tender, no organomegaly or masses. No bruits. Bowel sounds normal. No guarding, tenderness or rebound.   GU: Not examined, asymptomatic   Musculoskeletal exam: Full ROM of spine, hips , shoulders and knees. No deformity ,swelling or crepitus noted. No muscle wasting or atrophy.   Neurologic: Cranial nerves 2 to 12 intact. Power, tone ,sensation and reflexes normal throughout. No disturbance in gait. No tremor.  Skin: Intact, no ulceration, erythema , scaling or rash noted. Pigmentation normal throughout  Psych; Normal mood and affect. Judgement and concentration normal   Assessment & Plan:  Annual physical exam Annual exam as documented. Counseling done  re healthy lifestyle involving commitment to 150 minutes exercise per week, heart healthy diet, and attaining healthy weight.The importance of adequate sleep also discussed. Regular seat belt use and home safety, is also discussed. Changes in health  habits are decided on by the patient with goals and time frames  set for achieving them. Immunization and cancer screening needs are specifically addressed at this visit.   NICOTINE ADDICTION Asked:confirms currently smokes cigarettes Assess: willing to set a quit date,and will start nicoderm patches Advise: needs to QUIT to reduce risk of cancer, cardio and cerebrovascular disease Assist: counseled for 5 minutes and literature provided Arrange: follow up in 2 to 4 months   Obesity (BMI 30.0-34.9)  Patient re-educated about  the importance of commitment to a  minimum of 150 minutes of exercise per week as able.  The importance of healthy food choices with portion control discussed, as well as eating regularly and within a 12 hour window most days. The need to choose "clean , green" food 50 to 75% of the time is discussed, as well as to make water the primary drink and set a goal of 64 ounces water daily.    Weight /BMI 05/08/2020 11/06/2019 10/01/2019  WEIGHT 183 lb 186 lb 181 lb  HEIGHT 5\' 3"  5\' 3"  5\' 3"   BMI 32.42 kg/m2 32.95 kg/m2 32.06 kg/m2  Start half phentermine daily f/u in 4 months, 10 pound weight loss goal    Need for Tdap vaccination After obtaining informed consent, the vaccine is  administered , with no adverse effect noted at the time of administration.

## 2020-05-09 ENCOUNTER — Encounter: Payer: Self-pay | Admitting: Family Medicine

## 2020-05-09 ENCOUNTER — Encounter (HOSPITAL_COMMUNITY): Payer: Self-pay

## 2020-05-09 DIAGNOSIS — Z23 Encounter for immunization: Secondary | ICD-10-CM | POA: Diagnosis not present

## 2020-05-09 LAB — VITAMIN D 25 HYDROXY (VIT D DEFICIENCY, FRACTURES): Vit D, 25-Hydroxy: 21.1 ng/mL — ABNORMAL LOW (ref 30.0–100.0)

## 2020-05-09 LAB — LIPID PANEL
Chol/HDL Ratio: 2.7 ratio (ref 0.0–4.4)
Cholesterol, Total: 212 mg/dL — ABNORMAL HIGH (ref 100–199)
HDL: 78 mg/dL (ref >39)
LDL Chol Calc (NIH): 118 mg/dL — ABNORMAL HIGH (ref 0–99)
Triglycerides: 90 mg/dL (ref 0–149)
VLDL Cholesterol Cal: 16 mg/dL (ref 5–40)

## 2020-05-09 NOTE — Assessment & Plan Note (Signed)
After obtaining informed consent, the vaccine is  administered , with no adverse effect noted at the time of administration.  

## 2020-05-09 NOTE — Assessment & Plan Note (Signed)
  Patient re-educated about  the importance of commitment to a  minimum of 150 minutes of exercise per week as able.  The importance of healthy food choices with portion control discussed, as well as eating regularly and within a 12 hour window most days. The need to choose "clean , green" food 50 to 75% of the time is discussed, as well as to make water the primary drink and set a goal of 64 ounces water daily.    Weight /BMI 05/08/2020 11/06/2019 10/01/2019  WEIGHT 183 lb 186 lb 181 lb  HEIGHT 5\' 3"  5\' 3"  5\' 3"   BMI 32.42 kg/m2 32.95 kg/m2 32.06 kg/m2  Start half phentermine daily f/u in 4 months, 10 pound weight loss goal

## 2020-05-09 NOTE — Progress Notes (Signed)
Received referral for initial lung cancer screening scan. I have attempted ton reach the patient but was unable to reach the patient at this time. I have left a VM asking that the patient return my phone call.

## 2020-05-13 ENCOUNTER — Encounter: Payer: 59 | Admitting: Family Medicine

## 2020-05-19 ENCOUNTER — Encounter (HOSPITAL_COMMUNITY): Payer: Self-pay

## 2020-05-19 NOTE — Progress Notes (Signed)
Called placed to patient regarding LCS. Unable to reach patient at this time. Detailed VM left asking that the patient return my call.

## 2020-07-21 ENCOUNTER — Encounter (HOSPITAL_COMMUNITY): Payer: Self-pay

## 2020-07-21 NOTE — Progress Notes (Signed)
Unable to reach the patient regarding LCS. Detailed VM left asking that the patient return my call. Unable to reach the patient x3, referral closed at this time and PCP made aware.

## 2020-08-19 ENCOUNTER — Other Ambulatory Visit: Payer: Self-pay

## 2020-08-19 ENCOUNTER — Encounter: Payer: Self-pay | Admitting: Family Medicine

## 2020-08-19 ENCOUNTER — Ambulatory Visit (INDEPENDENT_AMBULATORY_CARE_PROVIDER_SITE_OTHER): Payer: 59 | Admitting: Family Medicine

## 2020-08-19 VITALS — BP 124/80 | HR 76 | Temp 99.1°F | Resp 20 | Ht 63.0 in | Wt 178.0 lb

## 2020-08-19 DIAGNOSIS — I10 Essential (primary) hypertension: Secondary | ICD-10-CM | POA: Diagnosis not present

## 2020-08-19 DIAGNOSIS — Z1231 Encounter for screening mammogram for malignant neoplasm of breast: Secondary | ICD-10-CM | POA: Diagnosis not present

## 2020-08-19 DIAGNOSIS — Z23 Encounter for immunization: Secondary | ICD-10-CM

## 2020-08-19 DIAGNOSIS — F17200 Nicotine dependence, unspecified, uncomplicated: Secondary | ICD-10-CM

## 2020-08-19 DIAGNOSIS — E669 Obesity, unspecified: Secondary | ICD-10-CM | POA: Diagnosis not present

## 2020-08-19 DIAGNOSIS — E785 Hyperlipidemia, unspecified: Secondary | ICD-10-CM

## 2020-08-19 DIAGNOSIS — E8881 Metabolic syndrome: Secondary | ICD-10-CM

## 2020-08-19 MED ORDER — PHENTERMINE HCL 37.5 MG PO TABS: ORAL_TABLET | ORAL | 1 refills | Status: DC

## 2020-08-19 NOTE — Progress Notes (Signed)
Christina Williamson     MRN: [id number]      DOB: August 02, 1955   HPI Ms. Castronova is here for follow up and re-evaluation of chronic medical conditions, medication management and review of any available recent lab and radiology data.  Preventive health is updated, specifically  Cancer screening and Immunization.    Was advised to stop benazepril and maxzide by Nephrology, to see effecto on renal function but has resumed. Has appt next week  Under a lot of stress , wants to continue triamterene.  There are no new concerns.  There are no specific complaints   ROS Denies recent fever or chills. Denies sinus pressure, nasal congestion, ear pain or sore throat. Denies chest congestion, productive cough or wheezing. Denies chest pains, palpitations and leg swelling Denies abdominal pain, nausea, vomiting,diarrhea or constipation.   Denies dysuria, frequency, hesitancy or incontinence. Denies joint pain, swelling and limitation in mobility. Denies headaches, seizures, numbness, or tingling. Denies depression, has increased anxiety or insomnia. Denies skin break down or rash.   PE  BP 124/80   Pulse 76   Temp 99.1 F (37.3 C)   Resp 20   Ht 5\' 3"  (1.6 m)   Wt 178 lb (80.7 kg)   SpO2 97%   BMI 31.53 kg/m   Patient alert and oriented and in no cardiopulmonary distress.  HEENT: No facial asymmetry, EOMI,     Neck supple .  Chest: Clear to auscultation bilaterally.  CVS: S1, S2 no murmurs, no S3.Regular rate.  ABD: Soft non tender.   Ext: No edema  MS: Adequate ROM spine, shoulders, hips and knees.  Skin: Intact, no ulcerations or rash noted.  Psych: Good eye contact, normal affect. Memory intact not anxious or depressed appearing.  CNS: CN 2-12 intact, power,  normal throughout.no focal deficits noted.   Assessment & Plan  Essential hypertension Controlled, no change in medication DASH diet and commitment to daily physical activity for a minimum of 30 minutes  discussed and encouraged, as a part of hypertension management. The importance of attaining a healthy weight is also discussed.  BP/Weight 08/19/2020 05/08/2020 11/06/2019 10/01/2019 03/27/2019 11/28/2018 06/12/2438  Systolic BP 102 725 366 440 347 425 956  Diastolic BP 80 74 84 87 78 64 64  Wt. (Lbs) 178 183 186 181 183.12 177 179  BMI 31.53 32.42 32.95 32.06 32.44 31.35 31.71       NICOTINE ADDICTION Asked:confirms currently smokes cigarettes 3 to 5/day Assess: Unwilling to set a quit date, but is cutting back Advise: needs to QUIT to reduce risk of cancer, cardio and cerebrovascular disease Assist: counseled for 5 minutes and literature provided Arrange: follow up in 2 to 4 months   Obesity (BMI 30.0-34.9)  Patient re-educated about  the importance of commitment to a  minimum of 150 minutes of exercise per week as able.  The importance of healthy food choices with portion control discussed, as well as eating regularly and within a 12 hour window most days. The need to choose "clean , green" food 50 to 75% of the time is discussed, as well as to make water the primary drink and set a goal of 64 ounces water daily.    Weight /BMI 08/19/2020 05/08/2020 11/06/2019  WEIGHT 178 lb 183 lb 186 lb  HEIGHT 5\' 3"  5\' 3"  5\' 3"   BMI 31.53 kg/m2 32.42 kg/m2 32.95 kg/m2    continue half phentermine daily  Dyslipidemia Hyperlipidemia:Low fat diet discussed and encouraged.   Lipid Panel  Lab Results  Component Value Date   CHOL 212 (H) 05/08/2020   HDL 78 05/08/2020   LDLCALC 118 (H) 05/08/2020   TRIG 90 05/08/2020   CHOLHDL 2.7 05/08/2020  Updated lab needed at/ before next visit.     Metabolic syndrome X The increased risk of cardiovascular disease associated with this diagnosis, and the need to consistently work on lifestyle to change this is discussed. Following  a  heart healthy diet ,commitment to 30 minutes of exercise at least 5 days per week, as well as control of blood sugar and  cholesterol , and achieving a healthy weight are all the areas to be addressed .

## 2020-08-19 NOTE — Assessment & Plan Note (Signed)
Hyperlipidemia:Low fat diet discussed and encouraged. ° ° °Lipid Panel  °Lab Results  °Component Value Date  ° CHOL 212 (H) 05/08/2020  ° HDL 78 05/08/2020  ° LDLCALC 118 (H) 05/08/2020  ° TRIG 90 05/08/2020  ° CHOLHDL 2.7 05/08/2020  ° ° ° °Updated lab needed at/ before next visit. ° °

## 2020-08-19 NOTE — Assessment & Plan Note (Signed)
  Patient re-educated about  the importance of commitment to a  minimum of 150 minutes of exercise per week as able.  The importance of healthy food choices with portion control discussed, as well as eating regularly and within a 12 hour window most days. The need to choose "clean , green" food 50 to 75% of the time is discussed, as well as to make water the primary drink and set a goal of 64 ounces water daily.    Weight /BMI 08/19/2020 05/08/2020 11/06/2019  WEIGHT 178 lb 183 lb 186 lb  HEIGHT 5\' 3"  5\' 3"  5\' 3"   BMI 31.53 kg/m2 32.42 kg/m2 32.95 kg/m2    continue half phentermine daily

## 2020-08-19 NOTE — Assessment & Plan Note (Signed)
The increased risk of cardiovascular disease associated with this diagnosis, and the need to consistently work on lifestyle to change this is discussed. Following  a  heart healthy diet ,commitment to 30 minutes of exercise at least 5 days per week, as well as control of blood sugar and cholesterol , and achieving a healthy weight are all the areas to be addressed .  

## 2020-08-19 NOTE — Patient Instructions (Signed)
Follow-up in 4 months call if you need me sooner.  Please schedule mammogram in July on a Monday or Tuesday morning appointment preferred   Pneumonia 23 today  Please get your second COVID booster in 2 weeks.    Nurse visit for second shingles vaccine at the end of July.  Weight loss goal of 8 to 10 pounds.Continue phentermine hyalf tablet daily  Please commit to exercising 30 minutes 4 to 5 days/week at least.  Please commit to reducing sugar and starch intake as well as fatty foods.  Increase intake of vegetables scalded vegetables like carrots spinach greens tomatoes also commit to 64 ounces of water daily.  Good that she has smoking 1 to 3 cigarettes daily you still do need to quit entirely to reduce your risk of cancer stroke and heart disease.

## 2020-08-19 NOTE — Assessment & Plan Note (Signed)
Controlled, no change in medication DASH diet and commitment to daily physical activity for a minimum of 30 minutes discussed and encouraged, as a part of hypertension management. The importance of attaining a healthy weight is also discussed.  BP/Weight 08/19/2020 05/08/2020 11/06/2019 10/01/2019 03/27/2019 11/28/2018 5/37/9432  Systolic BP 761 470 929 574 734 037 096  Diastolic BP 80 74 84 87 78 64 64  Wt. (Lbs) 178 183 186 181 183.12 177 179  BMI 31.53 32.42 32.95 32.06 32.44 31.35 31.71

## 2020-08-19 NOTE — Assessment & Plan Note (Signed)
Asked:confirms currently smokes cigarettes 3 to 5 / day Assess: Unwilling to set a quit date, but is cutting back Advise: needs to QUIT to reduce risk of cancer, cardio and cerebrovascular disease Assist: counseled for 5 minutes and literature provided Arrange: follow up in 2 to 4 months  

## 2020-09-15 ENCOUNTER — Other Ambulatory Visit: Payer: Self-pay

## 2020-09-15 ENCOUNTER — Ambulatory Visit (HOSPITAL_COMMUNITY)
Admission: RE | Admit: 2020-09-15 | Discharge: 2020-09-15 | Disposition: A | Discharge status: Home / Self Care | Payer: 59 | Source: Ambulatory Visit | Attending: Family Medicine | Admitting: Family Medicine

## 2020-09-15 DIAGNOSIS — Z1231 Encounter for screening mammogram for malignant neoplasm of breast: Secondary | ICD-10-CM | POA: Diagnosis present

## 2020-11-07 ENCOUNTER — Other Ambulatory Visit: Payer: Self-pay | Admitting: Family Medicine

## 2020-11-07 MED ORDER — PHENTERMINE HCL 37.5 MG PO TABS: ORAL_TABLET | ORAL | 0 refills | Status: DC

## 2020-12-03 ENCOUNTER — Other Ambulatory Visit: Payer: Self-pay | Admitting: Family Medicine

## 2020-12-22 ENCOUNTER — Ambulatory Visit: Payer: 59 | Admitting: Family Medicine

## 2021-01-06 ENCOUNTER — Ambulatory Visit: Payer: 59 | Admitting: Family Medicine

## 2021-02-17 ENCOUNTER — Ambulatory Visit: Payer: 59 | Admitting: Family Medicine

## 2021-03-24 ENCOUNTER — Other Ambulatory Visit: Payer: Self-pay | Admitting: Family Medicine

## 2021-04-07 ENCOUNTER — Other Ambulatory Visit: Payer: Self-pay

## 2021-04-07 ENCOUNTER — Other Ambulatory Visit: Payer: Self-pay | Admitting: Family Medicine

## 2021-04-07 ENCOUNTER — Ambulatory Visit (INDEPENDENT_AMBULATORY_CARE_PROVIDER_SITE_OTHER): Payer: 59 | Admitting: Family Medicine

## 2021-04-07 ENCOUNTER — Encounter (INDEPENDENT_AMBULATORY_CARE_PROVIDER_SITE_OTHER): Payer: Self-pay

## 2021-04-07 ENCOUNTER — Encounter: Payer: Self-pay | Admitting: Family Medicine

## 2021-04-07 VITALS — BP 137/83 | HR 70 | Ht 63.0 in | Wt 182.1 lb

## 2021-04-07 DIAGNOSIS — E669 Obesity, unspecified: Secondary | ICD-10-CM

## 2021-04-07 DIAGNOSIS — Z23 Encounter for immunization: Secondary | ICD-10-CM | POA: Diagnosis not present

## 2021-04-07 DIAGNOSIS — I10 Essential (primary) hypertension: Secondary | ICD-10-CM

## 2021-04-07 DIAGNOSIS — F17200 Nicotine dependence, unspecified, uncomplicated: Secondary | ICD-10-CM | POA: Diagnosis not present

## 2021-04-07 DIAGNOSIS — F17210 Nicotine dependence, cigarettes, uncomplicated: Secondary | ICD-10-CM | POA: Diagnosis not present

## 2021-04-07 DIAGNOSIS — Z122 Encounter for screening for malignant neoplasm of respiratory organs: Secondary | ICD-10-CM

## 2021-04-07 DIAGNOSIS — E559 Vitamin D deficiency, unspecified: Secondary | ICD-10-CM

## 2021-04-07 DIAGNOSIS — N184 Chronic kidney disease, stage 4 (severe): Secondary | ICD-10-CM

## 2021-04-07 DIAGNOSIS — E785 Hyperlipidemia, unspecified: Secondary | ICD-10-CM

## 2021-04-07 DIAGNOSIS — E66811 Obesity, class 1: Secondary | ICD-10-CM

## 2021-04-07 MED ORDER — WEGOVY 0.5 MG/0.5ML ~~LOC~~ SOAJ: 0.5000 mg | SUBCUTANEOUS | 1 refills | Status: DC

## 2021-04-07 MED ORDER — WEGOVY 0.25 MG/0.5ML ~~LOC~~ SOAJ: 0.2500 mg | SUBCUTANEOUS | 0 refills | Status: DC

## 2021-04-07 NOTE — Progress Notes (Signed)
Christina Williamson     MRN: [id number]      DOB: Jan 26, 1956   HPI Ms. Brodt is here for follow up and re-evaluation of chronic medical conditions, medication management and review of any available recent lab and radiology data.  Preventive health is updated, specifically  Cancer screening and Immunization.   Recent change in antihypertensives by Nephrology The PT denies any adverse reactions to current medications since the last visit.  Still smokes 3/ day, not willing to set a quit date Wnts alternative help with appetite suppression for weight loss, at a standstill   ROS Denies recent fever or chills. Denies sinus pressure, nasal congestion, ear pain or sore throat. Denies chest congestion, productive cough or wheezing. Denies chest pains, palpitations and leg swelling Denies abdominal pain, nausea, vomiting,diarrhea or constipation.   Denies dysuria, frequency, hesitancy or incontinence. Denies joint pain, swelling and limitation in mobility. Denies headaches, seizures, numbness, or tingling. Denies depression, anxiety or insomnia. Denies skin break down or rash.   PE  BP 137/83    Pulse 70    Ht 5\' 3"  (1.6 m)    Wt 182 lb 1.9 oz (82.6 kg)    SpO2 94%    BMI 32.26 kg/m   Patient alert and oriented and in no cardiopulmonary distress.  HEENT: No facial asymmetry, EOMI,     Neck supple .  Chest: Clear to auscultation bilaterally.  CVS: S1, S2 no murmurs, no S3.Regular rate.  ABD: Soft non tender.   Ext: No edema  MS: Adequate ROM spine, shoulders, hips and knees.  Skin: Intact, no ulcerations or rash noted.  Psych: Good eye contact, normal affect. Memory intact not anxious or depressed appearing.  CNS: CN 2-12 intact, power,  normal throughout.no focal deficits noted.   Assessment & Plan NICOTINE ADDICTION Asked:confirms currently smokes cigarettes approx 3 /day Assess: Unwilling to set a quit date, but is cutting back Advise: needs to QUIT to reduce risk of  cancer, cardio and cerebrovascular disease Assist: counseled for 5 minutes and literature provided Arrange: follow up in 2 to 4 months Needs annual chest scan  Essential hypertension Controlled, no change in medication DASH diet and commitment to daily physical activity for a minimum of 30 minutes discussed and encouraged, as a part of hypertension management. The importance of attaining a healthy weight is also discussed.  BP/Weight 04/07/2021 08/19/2020 05/08/2020 11/06/2019 10/01/2019 03/27/2019 2/84/1324  Systolic BP 401 027 253 664 403 474 259  Diastolic BP 83 80 74 84 87 78 64  Wt. (Lbs) 182.12 178 183 186 181 183.12 177  BMI 32.26 31.53 32.42 32.95 32.06 32.44 31.35       Dyslipidemia Hyperlipidemia:Low fat diet discussed and encouraged.   Lipid Panel  Lab Results  Component Value Date   CHOL 212 (H) 05/08/2020   HDL 78 05/08/2020   LDLCALC 118 (H) 05/08/2020   TRIG 90 05/08/2020   CHOLHDL 2.7 05/08/2020     Updated lab needed at/ before next visit.   CKD (chronic kidney disease) stage 4, GFR 15-29 ml/min (HCC) Managed by Nephrology, stable  Obesity (BMI 30.0-34.9)  Patient re-educated about  the importance of commitment to a  minimum of 150 minutes of exercise per week as able.  The importance of healthy food choices with portion control discussed, as well as eating regularly and within a 12 hour window most days. The need to choose "clean , green" food 50 to 75% of the time is discussed, as well as  to make water the primary drink and set a goal of 64 ounces water daily.    Weight /BMI 04/07/2021 08/19/2020 05/08/2020  WEIGHT 182 lb 1.9 oz 178 lb 183 lb  HEIGHT 5\' 3"  5\' 3"  5\' 3"   BMI 32.26 kg/m2 31.53 kg/m2 32.42 kg/m2  start weygovi, re eval in 2 months    Vitamin D deficiency Updated lab needed at/ before next visit.

## 2021-04-07 NOTE — Assessment & Plan Note (Signed)
Asked:confirms currently smokes cigarettes approx 3 /day Assess: Unwilling to set a quit date, but is cutting back Advise: needs to QUIT to reduce risk of cancer, cardio and cerebrovascular disease Assist: counseled for 5 minutes and literature provided Arrange: follow up in 2 to 4 months Needs annual chest scan

## 2021-04-07 NOTE — Assessment & Plan Note (Signed)
Hyperlipidemia:Low fat diet discussed and encouraged.   Lipid Panel  Lab Results  Component Value Date   CHOL 212 (H) 05/08/2020   HDL 78 05/08/2020   LDLCALC 118 (H) 05/08/2020   TRIG 90 05/08/2020   CHOLHDL 2.7 05/08/2020     Updated lab needed at/ before next visit.

## 2021-04-07 NOTE — Patient Instructions (Addendum)
Annual  with pap in 2 months, call if you need me sooner, and re  eval weight and smoking   Shingrix #2 and flu vaccine today  Work on QUITTING smoking  Fasting lipid, hepatic, and tSH 3 5 days before next appt  Farrel Conners is prescribed for weight loss  It is important that you exercise regularly at least 30 minutes 5 times a week. If you develop chest pain, have severe difficulty breathing, or feel very tired, stop exercising immediately and seek medical attention   Think about what you will eat, plan ahead. Choose " clean, green, fresh or frozen" over canned, processed or packaged foods which are more sugary, salty and fatty. 70 to 75% of food eaten should be vegetables and fruit. Three meals at set times with snacks allowed between meals, but they must be fruit or vegetables. Aim to eat over a 12 hour period , example 7 am to 7 pm, and STOP after  your last meal of the day. Drink water,generally about 64 ounces per day, no other drink is as healthy. Fruit juice is best enjoyed in a healthy way, by EATING the fruit.   Thanks for choosing [facility], we consider it a privelige to serve you.

## 2021-04-07 NOTE — Assessment & Plan Note (Signed)
Updated lab needed at/ before next visit.   

## 2021-04-07 NOTE — Assessment & Plan Note (Signed)
°  Patient re-educated about  the importance of commitment to a  minimum of 150 minutes of exercise per week as able.  The importance of healthy food choices with portion control discussed, as well as eating regularly and within a 12 hour window most days. The need to choose "clean , green" food 50 to 75% of the time is discussed, as well as to make water the primary drink and set a goal of 64 ounces water daily.    Weight /BMI 04/07/2021 08/19/2020 05/08/2020  WEIGHT 182 lb 1.9 oz 178 lb 183 lb  HEIGHT 5\' 3"  5\' 3"  5\' 3"   BMI 32.26 kg/m2 31.53 kg/m2 32.42 kg/m2  start weygovi, re eval in 2 months

## 2021-04-07 NOTE — Assessment & Plan Note (Signed)
Managed by Nephrology, stable

## 2021-04-07 NOTE — Assessment & Plan Note (Signed)
Controlled, no change in medication DASH diet and commitment to daily physical activity for a minimum of 30 minutes discussed and encouraged, as a part of hypertension management. The importance of attaining a healthy weight is also discussed.  BP/Weight 04/07/2021 08/19/2020 05/08/2020 11/06/2019 10/01/2019 03/27/2019 8/56/9437  Systolic BP 005 259 102 890 228 406 986  Diastolic BP 83 80 74 84 87 78 64  Wt. (Lbs) 182.12 178 183 186 181 183.12 177  BMI 32.26 31.53 32.42 32.95 32.06 32.44 31.35

## 2021-04-08 ENCOUNTER — Telehealth: Payer: Self-pay

## 2021-04-08 NOTE — Telephone Encounter (Signed)
Pts insurance does not cover any weight loss medications.  I spoke with [facility] today about this

## 2021-04-09 ENCOUNTER — Other Ambulatory Visit: Payer: Self-pay | Admitting: Family Medicine

## 2021-04-09 MED ORDER — PHENTERMINE HCL 37.5 MG PO TABS: 37.5000 mg | ORAL_TABLET | Freq: Every day | ORAL | 2 refills | Status: DC

## 2021-04-09 NOTE — Telephone Encounter (Signed)
Pt states that she would like to have the phentermine back and she has an appt in May already scheduled

## 2021-05-12 ENCOUNTER — Telehealth: Payer: Self-pay | Admitting: Family Medicine

## 2021-05-12 NOTE — Telephone Encounter (Signed)
Patient wants a call back in regard to heart flutters.  ? ? ?Next available appt with provider 3/23 , patient refused. ?

## 2021-05-13 NOTE — Telephone Encounter (Signed)
Spoke with pt this has been going on for 2 weeks she stated she could not make appt this week. Have her on the schedule for next week pt stated no sob no numbness or tingling in arms or hands no sweats just feels like sometimes its fluttering. Advised if sob numbness or tingling in hands or arms or sweating or dizziness to call 911 or go to local ER and to reach out if she needed [place] before then  ?

## 2021-05-20 ENCOUNTER — Encounter: Payer: Self-pay | Admitting: Family Medicine

## 2021-05-20 ENCOUNTER — Other Ambulatory Visit: Payer: Self-pay

## 2021-05-20 ENCOUNTER — Ambulatory Visit (INDEPENDENT_AMBULATORY_CARE_PROVIDER_SITE_OTHER): Payer: 59 | Admitting: Family Medicine

## 2021-05-20 VITALS — BP 138/86 | HR 68 | Ht 63.0 in | Wt 184.1 lb

## 2021-05-20 DIAGNOSIS — I10 Essential (primary) hypertension: Secondary | ICD-10-CM

## 2021-05-20 DIAGNOSIS — R5383 Other fatigue: Secondary | ICD-10-CM

## 2021-05-20 DIAGNOSIS — R9431 Abnormal electrocardiogram [ECG] [EKG]: Secondary | ICD-10-CM

## 2021-05-20 DIAGNOSIS — R0683 Snoring: Secondary | ICD-10-CM

## 2021-05-20 DIAGNOSIS — R002 Palpitations: Secondary | ICD-10-CM | POA: Diagnosis not present

## 2021-05-20 DIAGNOSIS — G4719 Other hypersomnia: Secondary | ICD-10-CM

## 2021-05-20 DIAGNOSIS — F17200 Nicotine dependence, unspecified, uncomplicated: Secondary | ICD-10-CM

## 2021-05-20 NOTE — Assessment & Plan Note (Signed)
2 week history with fatigue and new left arm pain, abn eKG, nultiple CV risk factors, refer Cardiology. ?I think steroids triggered the palpitations ?

## 2021-05-20 NOTE — Assessment & Plan Note (Signed)
Adequate control ?DASH diet and commitment to daily physical activity for a minimum of 30 minutes discussed and encouraged, as a part of hypertension management. ?The importance of attaining a healthy weight is also discussed. ? ? ?  05/20/2021  ?  1:46 PM 04/07/2021  ? 10:27 AM 08/19/2020  ?  9:19 AM 08/19/2020  ?  9:01 AM 05/08/2020  ?  8:11 AM 11/06/2019  ?  4:03 PM 10/01/2019  ? 11:01 AM  ?BP/Weight  ?Systolic BP 559 741 638 453 122 126 129  ?Diastolic BP 86 83 80 77 74 84 87  ?Wt. (Lbs) 184.12 182.12  178 183 186 181  ?BMI 32.62 kg/m2 32.26 kg/m2  31.53 kg/m2 32.42 kg/m2 32.95 kg/m2 32.06 kg/m2  ? ? ? ? ?

## 2021-05-20 NOTE — Patient Instructions (Addendum)
F/u as before, call if you need me sooner ? ?You are referred urgently to Cardiology and to pulmonary specialists ? ?Please continue to NOT smoke ? ?I believe the prednisone triggered the palpitations, but that should have neded by now as you have taken none for over 1 week ? ?Very important you keep both appointments ? ?I recommend that you use only tylenol for p;ain because of kidney disease ? ?Thanks for choosing [facility], we consider it a privelige to serve you. ? ?

## 2021-05-20 NOTE — Progress Notes (Signed)
? ?  Christina Williamson     MRN: [id number]      DOB: 08-31-1955 ? ? ?HPI ?Christina Williamson is here with a 2 week h/o daily palptitstions up to 3 times per day, 25 mins each, light headed, new left arm pain, started when she was p[placed on prednisone dose pack for foot pain by Podiatry, she took 2 of 6 day dose pack  days of proednisone, followed by 2 days  of mobic, still symptomatic ? ? ROS ?Denies recent fever or chills. ?Denies sinus pressure, nasal congestion, ear pain or sore throat. ?Denies chest congestion, productive cough or wheezing. ?aDenies abdominal pain, nausea, vomiting,diarrhea or constipation.   ?Denies dysuria, frequency, hesitancy or incontinence. ?Denies joint pain, swelling and limitation in mobility. ?Denies headaches, seizures, numbness, or tingling. ?Denies depression, anxiety or insomnia. ?Denies skin break down or rash. ? ? ?PE ? ?BP 138/86 (BP Location: Left Arm)   Pulse 68   Ht '5\' 3"'$  (1.6 m)   Wt 184 lb 1.9 oz (83.5 kg)   SpO2 96%   BMI 32.62 kg/m?  ? ?Patient alert and oriented and in no cardiopulmonary distress. ? ?HEENT: No facial asymmetry, EOMI,     Neck supple . ? ?Chest: Clear to auscultation bilaterally.No reproducible chest wall pain ? ?CVS: S1, S2 no murmurs, no S3.Regular rate. ? ?ABD: Soft non tender.  ? ?Ext: No edema ? ?MS: Adequate ROM spine, shoulders, hips and knees. ? ?Skin: Intact, no ulcerations or rash noted. ? ?Psych: Good eye contact, normal affect. Memory intact not anxious or depressed appearing. ? ?CNS: CN 2-12 intact, power,  normal throughout.no focal deficits noted. ? ? ?Assessment & Plan ? ?Palpitations ?2 week history with fatigue and new left arm pain, abn eKG, nultiple CV risk factors, refer Cardiology. ?I think steroids triggered the palpitations ? ?Excessive daytime sleepiness ?Needs eval for sleep apnea ? ?Essential hypertension ?Adequate control ?DASH diet and commitment to daily physical activity for a minimum of 30 minutes discussed and encouraged, as  a part of hypertension management. ?The importance of attaining a healthy weight is also discussed. ? ? ?  05/20/2021  ?  1:46 PM 04/07/2021  ? 10:27 AM 08/19/2020  ?  9:19 AM 08/19/2020  ?  9:01 AM 05/08/2020  ?  8:11 AM 11/06/2019  ?  4:03 PM 10/01/2019  ? 11:01 AM  ?BP/Weight  ?Systolic BP 932 355 732 202 122 126 129  ?Diastolic BP 86 83 80 77 74 84 87  ?Wt. (Lbs) 184.12 182.12  178 183 186 181  ?BMI 32.62 kg/m2 32.26 kg/m2  31.53 kg/m2 32.42 kg/m2 32.95 kg/m2 32.06 kg/m2  ? ? ? ? ? ?NICOTINE ADDICTION ?Asked:confirms currently smokes cigarettes ?Assess: Unwilling to set a quit date, but is cutting back ?Advise: needs to QUIT to reduce risk of cancer, cardio and cerebrovascular disease ?Assist: counseled for 5 minutes and literature provided ?Arrange: follow up in 2 to 4 months ? ? ? ?

## 2021-05-20 NOTE — Assessment & Plan Note (Signed)
Asked:confirms currently smokes cigarettes °Assess: Unwilling to set a quit date, but is cutting back °Advise: needs to QUIT to reduce risk of cancer, cardio and cerebrovascular disease °Assist: counseled for 5 minutes and literature provided °Arrange: follow up in 2 to 4 months ° °

## 2021-05-20 NOTE — Assessment & Plan Note (Signed)
Needs eval for sleep apnea ?

## 2021-05-26 ENCOUNTER — Ambulatory Visit: Payer: 59 | Admitting: Cardiology

## 2021-05-26 ENCOUNTER — Encounter: Payer: Self-pay | Admitting: Cardiology

## 2021-05-26 VITALS — BP 112/80 | HR 68 | Ht 63.0 in | Wt 185.0 lb

## 2021-05-26 DIAGNOSIS — R011 Cardiac murmur, unspecified: Secondary | ICD-10-CM

## 2021-05-26 NOTE — Progress Notes (Signed)
? ? ? ?Clinical Summary ?Ms. Garlock is a 66 y.o.female seen today as a new consult, referred by Dr Moshe Cipro for the following medical problems.  ? ?1.Palpitations ?- started on meloxicam and prendisone same time for foot pain ?- palpitations started at that time. Stopped prednisone without any improvement.  ?- episodes 1-2 times per day. Stopped prednisone about 2 weeks.  ?- recently on prednisone.  ? ?- previously high caffeine intake. No coffee, no tea, no sodas, no EtoH. Still drinks 1-2 energy drinks.  ?- takes coreg once daily ?- she is on phentermine about 1 year. She stopped about 3 months ago.  ?- can get left shoulder pain with fluttering.  ? ? ? ?2. OSA screen ?- upcoming eval with Dr Elsworth Soho ?Past Medical History:  ?Diagnosis Date  ? Hyperlipidemia   ? Hypertension   ? Nicotine addiction   ? Obesity   ? ? ? ?Allergies  ?Allergen Reactions  ? Ace Inhibitors Other (See Comments)  ?  Patient cannot remember reaction.  ? Penicillins   ?  Has patient had a PCN reaction causing immediate rash, facial/tongue/throat swelling, SOB or lightheadedness with hypotension: No ?Has patient had a PCN reaction causing severe rash involving mucus membranes or skin necrosis: No ?Has patient had a PCN reaction that required hospitalization No ?Has patient had a PCN reaction occurring within the last 10 years: No ?If all of the above answers are "NO", then may proceed with Cephalosporin use. ?  ? Prednisone Palpitations  ? ? ? ?Current Outpatient Medications  ?Medication Sig Dispense Refill  ? amLODipine (NORVASC) 10 MG tablet TAKE ONE TABLET BY MOUTH DAILY 90 tablet 1  ? carvedilol (COREG) 6.25 MG tablet Take 6.25 mg by mouth 2 (two) times daily.    ? chlorthalidone (HYGROTON) 25 MG tablet Take 25 mg by mouth daily.    ? ergocalciferol (VITAMIN D2) 1.25 MG (50000 UT) capsule Take 1 capsule (50,000 Units total) by mouth once a week. One capsule once weekly 12 capsule 1  ? phentermine (ADIPEX-P) 37.5 MG tablet Take 1 tablet  (37.5 mg total) by mouth daily before breakfast. 30 tablet 2  ? Semaglutide-Weight Management (WEGOVY) 0.25 MG/0.5ML SOAJ Inject 0.25 mg into the skin once a week. 2 mL 0  ? Semaglutide-Weight Management (WEGOVY) 0.5 MG/0.5ML SOAJ Inject 0.5 mg into the skin once a week. 2 mL 1  ? valsartan (DIOVAN) 40 MG tablet Take 40 mg by mouth daily.    ? ?No current facility-administered medications for this visit.  ? ? ? ?Past Surgical History:  ?Procedure Laterality Date  ? COLONOSCOPY N/A 01/29/2015  ? Procedure: COLONOSCOPY;  Surgeon: Rogene Houston, MD;  Location: [facility];  Service: Endoscopy;  Laterality: N/A;  730  ? PARTIAL HYSTERECTOMY  2002  ? secondary to fibroids   ? ? ? ?Allergies  ?Allergen Reactions  ? Ace Inhibitors Other (See Comments)  ?  Patient cannot remember reaction.  ? Penicillins   ?  Has patient had a PCN reaction causing immediate rash, facial/tongue/throat swelling, SOB or lightheadedness with hypotension: No ?Has patient had a PCN reaction causing severe rash involving mucus membranes or skin necrosis: No ?Has patient had a PCN reaction that required hospitalization No ?Has patient had a PCN reaction occurring within the last 10 years: No ?If all of the above answers are "NO", then may proceed with Cephalosporin use. ?  ? Prednisone Palpitations  ? ? ? ? ?Family History  ?Problem Relation Age of Onset  ?  Cancer Mother   ?     breast  ? Hypertension Mother   ? Stroke Mother   ? Hypertension Father   ? Diabetes Father   ? Stroke Father   ? Hypertension Brother   ? Hypertension Brother   ? Kidney disease Brother   ? Stroke Brother   ? ? ? ?Social History ?Ms. Nater reports that she has been smoking cigarettes. She has been smoking an average of .5 packs per day. She has never used smokeless tobacco. ?Ms. Iodice reports no history of alcohol use. ? ? ?Review of Systems ?CONSTITUTIONAL: No weight loss, fever, chills, weakness or fatigue.  ?HEENT: Eyes: No visual loss, blurred vision, double  vision or yellow sclerae.No hearing loss, sneezing, congestion, runny nose or sore throat.  ?SKIN: No rash or itching.  ?CARDIOVASCULAR: per hpi ?RESPIRATORY: No shortness of breath, cough or sputum.  ?GASTROINTESTINAL: No anorexia, nausea, vomiting or diarrhea. No abdominal pain or blood.  ?GENITOURINARY: No burning on urination, no polyuria ?NEUROLOGICAL: No headache, dizziness, syncope, paralysis, ataxia, numbness or tingling in the extremities. No change in bowel or bladder control.  ?MUSCULOSKELETAL: No muscle, back pain, joint pain or stiffness.  ?LYMPHATICS: No enlarged nodes. No history of splenectomy.  ?PSYCHIATRIC: No history of depression or anxiety.  ?ENDOCRINOLOGIC: No reports of sweating, cold or heat intolerance. No polyuria or polydipsia.  ?. ? ? ?Physical Examination ?Today's Vitals  ? 05/26/21 0850  ?BP: 112/80  ?Pulse: 68  ?SpO2: 98%  ?Weight: 185 lb (83.9 kg)  ?Height: '5\' 3"'$  (1.6 m)  ? ?Body mass index is 32.77 kg/m?. ? ?Gen: resting comfortably, no acute distress ?HEENT: no scleral icterus, pupils equal round and reactive, no palptable cervical adenopathy,  ?CV: RRR, 3/6 systolic murmur rusb ?Resp: Clear to auscultation bilaterally ?GI: abdomen is soft, non-tender, non-distended, normal bowel sounds, no hepatosplenomegaly ?MSK: extremities are warm, no edema.  ?Skin: warm, no rash ?Neuro:  no focal deficits ?Psych: appropriate affect ? ? ? ?Assessment and Plan  ? ?1.Palpitations ?- recent palpitations symptoms. Has been on prednisoen recently. She also reprots symptoms with being on meloxicam which has <2% association with palpitations. Previously on phentermine but not taking within last 3 months. Does consume 1-2 energy drinks daily. Only taking her coreg once daily ?- wean caffine, take coreg bid and monitor symptoms. Call us in 1 week, if ongoing would obtain 7 day zio patch ?- baseline EKG from pcp reviewed, shows SR LAFB, LAE ? ?2.Heart murmur ?- order echo ? ? ?F/u pending symptoms and  echo results ? ?Arnoldo Lenis, M.D. ?

## 2021-05-26 NOTE — Patient Instructions (Addendum)
Medication Instructions:  ?Your physician has recommended you make the following change in your medication:  ?Start staking carvedilol twice a day ?Continue all other medications as directed ? ?Labwork: ?none ? ?Testing/Procedures: ?Your physician has requested that you have an echocardiogram. Echocardiography is a painless test that uses sound waves to create images of your heart. It provides your doctor with information about the size and shape of your heart and how well your heart?s chambers and valves are working. This procedure takes approximately one hour. There are no restrictions for this procedure.  ? ?Follow-Up: ?Your physician recommends that you schedule a follow-up appointment in: Follow up pending ? ?Any Other Special Instructions Will Be Listed Below (If Applicable). ? ?Call the office in one week with an update on any palpitations. ? ?If you need a refill on your cardiac medications before your next appointment, please call your pharmacy. ? ?

## 2021-05-27 ENCOUNTER — Ambulatory Visit (HOSPITAL_COMMUNITY): Payer: 59

## 2021-06-02 ENCOUNTER — Telehealth: Payer: Self-pay | Admitting: Cardiology

## 2021-06-02 NOTE — Telephone Encounter (Signed)
? ?  Pt said, she was told by Dr. Harl Bowie to call today to give him an update about her symptoms  ?

## 2021-06-02 NOTE — Telephone Encounter (Signed)
If worsening symptoms let us know and would plan for monitor at that time. As long as doing well continue to limit caffeine and contineu coreg bid ? ?J Tallie Dodds MD ?

## 2021-06-02 NOTE — Telephone Encounter (Signed)
Reports palpitations have improved since taking carvedilol 6.25 mg BID ?

## 2021-06-08 ENCOUNTER — Encounter: Payer: Self-pay | Admitting: Family Medicine

## 2021-06-10 ENCOUNTER — Encounter: Payer: Self-pay | Admitting: Pulmonary Disease

## 2021-06-10 ENCOUNTER — Other Ambulatory Visit: Payer: Self-pay

## 2021-06-10 ENCOUNTER — Ambulatory Visit: Payer: 59 | Admitting: Pulmonary Disease

## 2021-06-10 VITALS — BP 132/80 | HR 76 | Temp 98.2°F | Ht 63.0 in | Wt 185.2 lb

## 2021-06-10 DIAGNOSIS — F17200 Nicotine dependence, unspecified, uncomplicated: Secondary | ICD-10-CM

## 2021-06-10 DIAGNOSIS — G4719 Other hypersomnia: Secondary | ICD-10-CM | POA: Diagnosis not present

## 2021-06-10 DIAGNOSIS — R0683 Snoring: Secondary | ICD-10-CM

## 2021-06-10 MED ORDER — WEGOVY 0.25 MG/0.5ML ~~LOC~~ SOAJ: 0.2500 mg | SUBCUTANEOUS | 0 refills | Status: DC

## 2021-06-10 NOTE — Patient Instructions (Signed)
? ?  X Home sleep study ?

## 2021-06-10 NOTE — Progress Notes (Signed)
? ?Subjective:  ? ? Patient ID: Christina Williamson, female    DOB: 1955-10-26, 66 y.o.   MRN: [id number] ? ?HPI ? ?Chief Complaint  ?Patient presents with  ? Consult  ?  Patient referred by Dr. Moshe Cipro for suspected OSA after having heart palpitations. States she saw cardiology regarding heart palpitations and has been put on medicine and issue has been resolved. No trouble with sleep per patient   ? ? 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 ?She developed foot pain and was given prednisone and meloxicam, while on prednisone she developed increased palpitations.  She was on phentermine which she stopped taking about 3 months ago and is now trying semaglutide for weight loss.  She takes an energy plus drink which contains caffeine.  Her Coreg was adjusted, reviewed cardiology consultation from 3/28 and since then her palpitations have decreased. ?Epworth Sleepiness Scale is 8 and she reports sleepiness as a passenger in a car or lying down to rest in the afternoons. ?Husband has noted snoring and she reports occasional choking episodes in her sleep. ?Bedtime is around 8 PM when she retires to her bedroom, TV stays on, sleep latency can be about 3 hours so about 11 PM before she actually falls asleep, she sleeps on her side with 2 pillows, reports 1-2 nocturnal awakenings including nocturia and is out of bed at 7 AM feeling rested without dryness of mouth or headaches. ?She has gained about 13 pounds over the last 2 years ?There is no history suggestive of cataplexy, sleep paralysis or parasomnias ? ? ? ? ? ? ?Past Medical History:  ?Diagnosis Date  ? Hyperlipidemia   ? Hypertension   ? Nicotine addiction   ? Obesity   ? ? ?Past Surgical History:  ?Procedure Laterality Date  ? COLONOSCOPY N/A 01/29/2015  ? Procedure: COLONOSCOPY;  Surgeon: Rogene Houston, MD;  Location[facility];  Service: Endoscopy;  Laterality: N/A;   730  ? PARTIAL HYSTERECTOMY  2002  ? secondary to fibroids   ? ? ?Allergies  ?Allergen Reactions  ? Ace Inhibitors Other (See Comments)  ?  Patient cannot remember reaction.  ? Penicillins   ?  Has patient had a PCN reaction causing immediate rash, facial/tongue/throat swelling, SOB or lightheadedness with hypotension: No ?Has patient had a PCN reaction causing severe rash involving mucus membranes or skin necrosis: No ?Has patient had a PCN reaction that required hospitalization No ?Has patient had a PCN reaction occurring within the last 10 years: No ?If all of the above answers are "NO", then may proceed with Cephalosporin use. ?  ? Prednisone Palpitations  ? ? ?Social History  ? ?Socioeconomic History  ? Marital status: Married  ?  Spouse name: Not on file  ? Number of children: 1  ? Years of education: Not on file  ? Highest education level: Not on file  ?Occupational History  ? Occupation: hairdresser   ?Tobacco Use  ? Smoking status: Every Day  ?  Packs/day: 0.50  ?  Types: Cigarettes  ? Smokeless tobacco: Never  ? Tobacco comments:  ?  3 per day   ?Substance and Sexual Activity  ? Alcohol use: No  ?  Alcohol/week: 0.0 standard drinks  ? Drug use: No  ? Sexual activity: Not Currently  ?Other Topics Concern  ? Not on file  ?Social History Narrative  ? Not on file  ? ?Social Determinants of Health  ? ?  Financial Resource Strain: Not on file  ?Food Insecurity: Not on file  ?Transportation Needs: Not on file  ?Physical Activity: Not on file  ?Stress: Not on file  ?Social Connections: Not on file  ?Intimate Partner Violence: Not on file  ? ?Family History  ?Problem Relation Age of Onset  ? Cancer Mother   ?     breast  ? Hypertension Mother   ? Stroke Mother   ? Hypertension Father   ? Diabetes Father   ? Stroke Father   ? Hypertension Brother   ? Hypertension Brother   ? Kidney disease Brother   ? Stroke Brother   ? ? ? ?Review of Systems ? ?Constitutional: negative for anorexia, fevers and sweats  ?Eyes: negative  for irritation, redness and visual disturbance  ?Ears, nose, mouth, throat, and face: negative for earaches, epistaxis, nasal congestion and sore throat  ?Respiratory: negative for cough, dyspnea on exertion, sputum and wheezing  ?Cardiovascular: negative for chest pain, dyspnea, lower extremity edema, orthopnea, palpitations and syncope  ?Gastrointestinal: negative for abdominal pain, constipation, diarrhea, melena, nausea and vomiting  ?Genitourinary:negative for dysuria, frequency and hematuria  ?Hematologic/lymphatic: negative for bleeding, easy bruising and lymphadenopathy  ?Musculoskeletal:negative for arthralgias, muscle weakness and stiff joints  ?Neurological: negative for coordination problems, gait problems, headaches and weakness  ?Endocrine: negative for diabetic symptoms including polydipsia, polyuria and weight loss ? ?   ?Objective:  ? Physical Exam ? ?Gen. Pleasant, obese, in no distress, normal affect ?ENT - no pallor,icterus, no post nasal drip, class 2 airway ?Neck: No JVD, no thyromegaly, no carotid bruits ?Lungs: no use of accessory muscles, no dullness to percussion, decreased without rales or rhonchi  ?Cardiovascular: Rhythm regular, heart sounds  normal, no murmurs or gallops, no peripheral edema ?Abdomen: soft and non-tender, no hepatosplenomegaly, BS normal. ?Musculoskeletal: No deformities, no cyanosis or clubbing ?Neuro:  alert, non focal, no tremors ? ? ? ?   ?Assessment & Plan:  ? ? ?

## 2021-06-10 NOTE — Assessment & Plan Note (Signed)
Emphasized need to quit smoking. ?She has tried nicotine patches without benefit, does not want nicotine gum, has dentures.  I advised nicotine lozenges or can try Nicotrol inhaler which is prescription. ?Screening CT chest has been scheduled ?

## 2021-06-10 NOTE — Assessment & Plan Note (Signed)
Red flags here are hypertension requiring 3 medications and loud snoring. ?However pretest probability is low to intermediate ? The pathophysiology of obstructive sleep apnea , it's cardiovascular consequences & modes of treatment including CPAP were discused with the patient in detail & they evidenced understanding. ?Home Study would be appropriate ? ?

## 2021-06-23 ENCOUNTER — Ambulatory Visit (HOSPITAL_COMMUNITY)
Admission: RE | Admit: 2021-06-23 | Discharge: 2021-06-23 | Disposition: A | Discharge status: Home / Self Care | Payer: 59 | Source: Ambulatory Visit | Attending: Family Medicine | Admitting: Family Medicine

## 2021-06-23 DIAGNOSIS — Z122 Encounter for screening for malignant neoplasm of respiratory organs: Secondary | ICD-10-CM | POA: Insufficient documentation

## 2021-06-30 ENCOUNTER — Ambulatory Visit (INDEPENDENT_AMBULATORY_CARE_PROVIDER_SITE_OTHER): Payer: 59

## 2021-06-30 ENCOUNTER — Telehealth: Payer: Self-pay | Admitting: Family Medicine

## 2021-06-30 DIAGNOSIS — R011 Cardiac murmur, unspecified: Secondary | ICD-10-CM

## 2021-06-30 LAB — HEPATIC FUNCTION PANEL
ALT: 18 IU/L (ref 0–32)
AST: 20 IU/L (ref 0–40)
Albumin: 4 g/dL (ref 3.8–4.8)
Alkaline Phosphatase: 105 IU/L (ref 44–121)
Bilirubin Total: 0.3 mg/dL (ref 0.0–1.2)
Bilirubin, Direct: 0.1 mg/dL (ref 0.00–0.40)
Total Protein: 6.8 g/dL (ref 6.0–8.5)

## 2021-06-30 LAB — LIPID PANEL
Chol/HDL Ratio: 2.6 ratio (ref 0.0–4.4)
Cholesterol, Total: 193 mg/dL (ref 100–199)
HDL: 73 mg/dL (ref >39)
LDL Chol Calc (NIH): 99 mg/dL (ref 0–99)
Triglycerides: 121 mg/dL (ref 0–149)
VLDL Cholesterol Cal: 21 mg/dL (ref 5–40)

## 2021-06-30 LAB — TSH: TSH: 1.39 u[IU]/mL (ref 0.450–4.500)

## 2021-06-30 LAB — ECHOCARDIOGRAM COMPLETE
AR max vel: 1.68 cm2
AV Area VTI: 1.95 cm2
AV Area mean vel: 1.96 cm2
AV Mean grad: 4 mmHg
AV Peak grad: 9.7 mmHg
AV Vena cont: 0.33 cm
Ao pk vel: 1.56 m/s
Area-P 1/2: 3.2 cm2
Calc EF: 70.9 %
P 1/2 time: 687 msec
S' Lateral: 2.73 cm
Single Plane A2C EF: 72.5 %
Single Plane A4C EF: 68.5 %

## 2021-06-30 NOTE — Telephone Encounter (Signed)
Patient requesting a call back , states she has a few questions. Would not specify needs. ?

## 2021-07-02 ENCOUNTER — Other Ambulatory Visit: Payer: Self-pay | Admitting: Family Medicine

## 2021-07-03 NOTE — Telephone Encounter (Signed)
Advice worker. Will talk with dr about it at her next appt  ?

## 2021-07-06 ENCOUNTER — Telehealth: Payer: Self-pay | Admitting: Cardiology

## 2021-07-06 NOTE — Telephone Encounter (Signed)
Pt returning nurses call regarding ECHO results. Please advise 

## 2021-07-07 ENCOUNTER — Other Ambulatory Visit (HOSPITAL_COMMUNITY)
Admission: RE | Admit: 2021-07-07 | Discharge: 2021-07-07 | Disposition: A | Discharge status: Home / Self Care | Payer: 59 | Source: Ambulatory Visit | Attending: Family Medicine | Admitting: Family Medicine

## 2021-07-07 ENCOUNTER — Ambulatory Visit (INDEPENDENT_AMBULATORY_CARE_PROVIDER_SITE_OTHER): Payer: 59 | Admitting: Family Medicine

## 2021-07-07 ENCOUNTER — Encounter: Payer: Self-pay | Admitting: Family Medicine

## 2021-07-07 VITALS — BP 139/91 | HR 77 | Ht 63.0 in | Wt 181.0 lb

## 2021-07-07 DIAGNOSIS — E669 Obesity, unspecified: Secondary | ICD-10-CM | POA: Diagnosis not present

## 2021-07-07 DIAGNOSIS — N184 Chronic kidney disease, stage 4 (severe): Secondary | ICD-10-CM | POA: Diagnosis not present

## 2021-07-07 DIAGNOSIS — I10 Essential (primary) hypertension: Secondary | ICD-10-CM

## 2021-07-07 DIAGNOSIS — Z124 Encounter for screening for malignant neoplasm of cervix: Secondary | ICD-10-CM | POA: Insufficient documentation

## 2021-07-07 DIAGNOSIS — D126 Benign neoplasm of colon, unspecified: Secondary | ICD-10-CM

## 2021-07-07 DIAGNOSIS — K7689 Other specified diseases of liver: Secondary | ICD-10-CM

## 2021-07-07 DIAGNOSIS — Z1231 Encounter for screening mammogram for malignant neoplasm of breast: Secondary | ICD-10-CM

## 2021-07-07 DIAGNOSIS — Z0000 Encounter for general adult medical examination without abnormal findings: Secondary | ICD-10-CM | POA: Diagnosis not present

## 2021-07-07 NOTE — Addendum Note (Signed)
Addended by: Fayrene Helper on: 07/07/2021 01:42 PM ? ? Modules accepted: Orders ? ?

## 2021-07-07 NOTE — Assessment & Plan Note (Signed)
Several cysts seen on recent chest scan , will order dedicated US of liver ?

## 2021-07-07 NOTE — Telephone Encounter (Signed)
Echo looks good, normal heart function. Very slight leak of aortic valve not of concern at this time. Did have a liver cyst noted, I have forwarded to Dr Moshe Cipro to see if any additional testing is indicated ?  ?  ?Christina Abts MD ? ? ? ? ?Results discussed with patient.She has apt with pcp today. ?

## 2021-07-07 NOTE — Assessment & Plan Note (Signed)

## 2021-07-07 NOTE — Progress Notes (Signed)
? ? ?Christina Williamson     MRN: [id number]      DOB: 1955/04/22 ? ?HPI: ?Patient is in for annual physical exam. ?Requests medication for weight loss ?Imaging reviewed, liver cysts on upper liver noted, dedicated US of liver and kidneys will be ordered. ?Recent labs,  are reviewed. ?Immunization is reviewed , and  updated if needed. ? ? ?PE: ?BP (!) 139/91   Pulse 77   Ht '5\' 3"'$  (1.6 m)   Wt 181 lb 0.6 oz (82.1 kg)   SpO2 94%   BMI 32.07 kg/m?  ? ?Pleasant  female, alert and oriented x 3, in no cardio-pulmonary distress. ?Afebrile. ?HEENT ?No facial trauma or asymetry. Sinuses non tender.  ?Extra occullar muscles intact.Marland Kitchen ?External ears normal, . ?Neck: supple, no adenopathy,JVD or thyromegaly.No bruits. ? ?Chest: ?Clear to ascultation bilaterally.No crackles or wheezes. ?Non tender to palpation ? ?Breast: ?No asymetry,no masses or lumps. No tenderness. No nipple discharge or inversion. ?No axillary or supraclavicular adenopathy ? ?Cardiovascular system; ?Heart sounds normal,  S1 and  S2 ,no S3.  No murmur, or thrill. ?Apical beat not displaced ?Peripheral pulses normal. ? ?Abdomen: ?Soft, non tender, no organomegaly or masses. ?No bruits. ?Bowel sounds normal. ?No guarding, tenderness or rebound. ? ? ?GU: ?External genitalia normal female genitalia , normal female distribution of hair. No lesions. ?Urethral meatus normal in size, no  Prolapse, no lesions visibly  Present. ?Bladder non tender. ?Vagina pink and moist , with no visible lesions , discharge present . ?Adequate pelvic support no  cystocele or rectocele noted ?Uterus absent, no adnexal masses, no  adnexal tenderness. ? ? ?Musculoskeletal exam: ?Full ROM of spine, hips , shoulders and knees. ?No deformity ,swelling or crepitus noted. ?No muscle wasting or atrophy.  ? ?Neurologic: ?Cranial nerves 2 to 12 intact. ?Power, tone ,sensation and reflexes normal throughout. ?No disturbance in gait. No tremor. ? ?Skin: ?Intact, no ulceration, erythema , scaling  or rash noted. ?Pigmentation normal throughout ? ?Psych; ?Normal mood and affect. Judgement and concentration normal ? ? ?Assessment & Plan:  ? ?Annual physical exam ?Annual exam as documented. ?Counseling done  re healthy lifestyle involving commitment to 150 minutes exercise per week, heart healthy diet, and attaining healthy weight.The importance of adequate sleep also discussed. ?Regular seat belt use and home safety, is also discussed. ?Changes in health habits are decided on by the patient with goals and time frames  set for achieving them. ?Immunization and cancer screening needs are specifically addressed at this visit. ? ? ?Obesity (BMI 30.0-34.9) ? ?Patient re-educated about  the importance of commitment to a  minimum of 150 minutes of exercise per week as able. ? ?The importance of healthy food choices with portion control discussed, as well as eating regularly and within a 12 hour window most days. ?The need to choose "clean , green" food 50 to 75% of the time is discussed, as well as to make water the primary drink and set a goal of 64 ounces water daily. ? ?  ? ?  07/07/2021  ?  1:08 PM 06/10/2021  ?  8:33 AM 05/26/2021  ?  8:50 AM  ?Weight /BMI  ?Weight 181 lb 0.6 oz 185 lb 3.2 oz 185 lb  ?Height '5\' 3"'$  (1.6 m) '5\' 3"'$  (1.6 m) '5\' 3"'$  (1.6 m)  ?BMI 32.07 kg/m2 32.81 kg/m2 32.77 kg/m2  ?Start half phentermine daily and benadryl at bedtime for sleep ? ? ? ?CKD (chronic kidney disease) stage 4, GFR 15-29 ml/min (HCC) ?  Need [place] of kidneys ? ?Liver cyst ?Several cysts seen on recent chest scan , will order dedicated US of liver\ ?

## 2021-07-07 NOTE — Patient Instructions (Signed)
F/u in 4 months, call if you need me sooner ? ?You are referred for colonoscopy, past due ? ?Please schedule mammogram at checkout ? ?Start half phentermine daily, take benadryl one at night for sleep ? ?It is important that you exercise regularly at least 30 minutes 5 times a week. If you develop chest pain, have severe difficulty breathing, or feel very tired, stop exercising immediately and seek medical attention  ? ?Eat mainly vegetables and fruit, fresh or frozen, protein, eggs , beans and drink 64 ounces water daily ? ?Weight loss goal of 10 pounds ? ?Plan to STOP smoking cigarettes in the next 4 months, you only smoke 2/day, you CAN QUIT ? ?You will be referred for [place] of kidneys and liver, we will contact you with appointment information ? ?Thanks for choosing [facility], we consider it a privelige to serve you. ? ? ? ?

## 2021-07-07 NOTE — Assessment & Plan Note (Signed)
?  Patient re-educated about  the importance of commitment to a  minimum of 150 minutes of exercise per week as able. ? ?The importance of healthy food choices with portion control discussed, as well as eating regularly and within a 12 hour window most days. ?The need to choose "clean , green" food 50 to 75% of the time is discussed, as well as to make water the primary drink and set a goal of 64 ounces water daily. ? ?  ? ?  07/07/2021  ?  1:08 PM 06/10/2021  ?  8:33 AM 05/26/2021  ?  8:50 AM  ?Weight /BMI  ?Weight 181 lb 0.6 oz 185 lb 3.2 oz 185 lb  ?Height '5\' 3"'$  (1.6 m) '5\' 3"'$  (1.6 m) '5\' 3"'$  (1.6 m)  ?BMI 32.07 kg/m2 32.81 kg/m2 32.77 kg/m2  ?Start half phentermine daily and benadryl at bedtime for sleep ? ? ?

## 2021-07-07 NOTE — Assessment & Plan Note (Signed)
Need [place] of kidneys ?

## 2021-07-08 ENCOUNTER — Telehealth: Payer: Self-pay

## 2021-07-08 NOTE — Telephone Encounter (Signed)
Patient notified and verbalized understanding. Patient had no questions or concerns at this time.  

## 2021-07-08 NOTE — Telephone Encounter (Signed)
-----   Message from Arnoldo Lenis, MD sent at 07/06/2021  3:53 PM EDT ----- ?Echo looks good, normal heart function. Very slight leak of aortic valve not of concern at this time. Did have a liver cyst noted, I have forwarded to Dr Moshe Cipro to see if any additional testing is indicated ? ? ?Zandra Abts MD ?

## 2021-07-10 LAB — CYTOLOGY - PAP
Adequacy: ABSENT
Comment: NEGATIVE
Diagnosis: NEGATIVE
High risk HPV: NEGATIVE

## 2021-07-16 ENCOUNTER — Ambulatory Visit (HOSPITAL_COMMUNITY): Payer: 59

## 2021-07-21 ENCOUNTER — Ambulatory Visit (HOSPITAL_COMMUNITY)
Admission: RE | Admit: 2021-07-21 | Discharge: 2021-07-21 | Disposition: A | Discharge status: Home / Self Care | Payer: 59 | Source: Ambulatory Visit | Attending: Family Medicine | Admitting: Family Medicine

## 2021-07-21 DIAGNOSIS — N184 Chronic kidney disease, stage 4 (severe): Secondary | ICD-10-CM | POA: Insufficient documentation

## 2021-07-21 DIAGNOSIS — K7689 Other specified diseases of liver: Secondary | ICD-10-CM | POA: Diagnosis present

## 2021-07-21 DIAGNOSIS — I10 Essential (primary) hypertension: Secondary | ICD-10-CM | POA: Diagnosis present

## 2021-07-22 ENCOUNTER — Other Ambulatory Visit (HOSPITAL_COMMUNITY): Payer: 59

## 2021-09-05 ENCOUNTER — Encounter: Payer: Self-pay | Admitting: Family Medicine

## 2021-09-07 ENCOUNTER — Other Ambulatory Visit: Payer: Self-pay | Admitting: Family Medicine

## 2021-09-07 ENCOUNTER — Telehealth: Payer: Self-pay | Admitting: Family Medicine

## 2021-09-07 MED ORDER — PHENTERMINE HCL 37.5 MG PO TABS: 37.5000 mg | ORAL_TABLET | Freq: Every day | ORAL | 1 refills | Status: DC

## 2021-09-07 NOTE — Telephone Encounter (Signed)
Pt called stating she thought she was supposed to have refills sent in back in May for her medication. She went to refill and there was no refills. Can you please refill phentermine (ADIPEX-P) 37.5 MG tablet?      [facility]

## 2021-09-07 NOTE — Telephone Encounter (Signed)
Patient aware.

## 2021-09-07 NOTE — Telephone Encounter (Signed)
See previous tele msg patient aware medication sent.

## 2021-09-21 ENCOUNTER — Ambulatory Visit (HOSPITAL_COMMUNITY): Payer: 59

## 2021-09-28 ENCOUNTER — Encounter (HOSPITAL_COMMUNITY): Payer: Self-pay

## 2021-09-28 ENCOUNTER — Ambulatory Visit (HOSPITAL_COMMUNITY): Payer: 59

## 2021-10-12 ENCOUNTER — Other Ambulatory Visit: Payer: Self-pay | Admitting: Family Medicine

## 2021-11-10 ENCOUNTER — Ambulatory Visit (INDEPENDENT_AMBULATORY_CARE_PROVIDER_SITE_OTHER): Payer: 59 | Admitting: Family Medicine

## 2021-11-10 ENCOUNTER — Encounter: Payer: Self-pay | Admitting: Family Medicine

## 2021-11-10 VITALS — BP 150/70 | HR 66 | Ht 63.0 in | Wt 181.0 lb

## 2021-11-10 DIAGNOSIS — R252 Cramp and spasm: Secondary | ICD-10-CM

## 2021-11-10 DIAGNOSIS — Z23 Encounter for immunization: Secondary | ICD-10-CM

## 2021-11-10 DIAGNOSIS — E669 Obesity, unspecified: Secondary | ICD-10-CM | POA: Diagnosis not present

## 2021-11-10 DIAGNOSIS — F17200 Nicotine dependence, unspecified, uncomplicated: Secondary | ICD-10-CM

## 2021-11-10 DIAGNOSIS — R69 Illness, unspecified: Secondary | ICD-10-CM | POA: Diagnosis not present

## 2021-11-10 DIAGNOSIS — I10 Essential (primary) hypertension: Secondary | ICD-10-CM | POA: Diagnosis not present

## 2021-11-10 MED ORDER — SEMAGLUTIDE-WEIGHT MANAGEMENT 0.25 MG/0.5ML ~~LOC~~ SOAJ: 0.2500 mg | SUBCUTANEOUS | 0 refills | Status: DC

## 2021-11-10 NOTE — Assessment & Plan Note (Signed)
  Patient re-educated about  the importance of commitment to a  minimum of 150 minutes of exercise per week as able.  The importance of healthy food choices with portion control discussed, as well as eating regularly and within a 12 hour window most days. The need to choose "clean , green" food 50 to 75% of the time is discussed, as well as to make water the primary drink and set a goal of 64 ounces water daily.       11/10/2021    1:29 PM 07/07/2021    1:08 PM 06/10/2021    8:33 AM  Weight /BMI  Weight 181 lb 0.6 oz 181 lb 0.6 oz 185 lb 3.2 oz  Height '5\' 3"'$  (1.6 m) '5\' 3"'$  (1.6 m) '5\' 3"'$  (1.6 m)  BMI 32.07 kg/m2 32.07 kg/m2 32.81 kg/m2   start wegovy

## 2021-11-10 NOTE — Assessment & Plan Note (Signed)
Uncontrolled, no med change as gebenerally controled, reduce salt and canned  Foods DASH diet and commitment to daily physical activity for a minimum of 30 minutes discussed and encouraged, as a part of hypertension management. The importance of attaining a healthy weight is also discussed.     11/10/2021    1:30 PM 11/10/2021    1:29 PM 07/07/2021    1:08 PM 06/10/2021    8:33 AM 05/26/2021    8:50 AM 05/20/2021    1:46 PM 04/07/2021   10:27 AM  BP/Weight  Systolic BP 670 110 034 961 164 353 912  Diastolic BP 70 84 91 80 80 86 83  Wt. (Lbs)  181.04 181.04 185.2 185 184.12 182.12  BMI  32.07 kg/m2 32.07 kg/m2 32.81 kg/m2 32.77 kg/m2 32.62 kg/m2 32.26 kg/m2

## 2021-11-10 NOTE — Patient Instructions (Addendum)
Welcome to medicare end January, call if you need me sooner  Chem 7 and EGFR and Magnesium level today  Please schedule and get your mammogram Flu vaccine today  Colonoscopy is due this year   Stop phentermine  I have prescribed wegovy for weight loss , we will see if your insurance cover it   Need to plan on and stop smoking  , this will reduce risk of heart disease, stroke, cancer  Work on reducing salt, sodium, canned foods, blood pressure is elevated today  Tonic water, a small amount may help with cramps, also start stretching exercises  Thanks for choosing [facility], we consider it a privelige to serve you.

## 2021-11-10 NOTE — Assessment & Plan Note (Signed)
Asked:confirms currently smokes cigarettes 3 to 5 / day Assess: Unwilling to set a quit date, but is cutting back Advise: needs to QUIT to reduce risk of cancer, cardio and cerebrovascular disease Assist: counseled for 5 minutes and literature provided Arrange: follow up in 2 to 4 months

## 2021-11-10 NOTE — Assessment & Plan Note (Signed)
C/o leg cramps in the morning on awakening, check potassium and magnesium levels Need to start stretching exercises, also tonic water smail amt and mustard

## 2021-11-10 NOTE — Progress Notes (Signed)
Christina Williamson     MRN: [id number]      DOB: October 05, 1955   HPI Christina Williamson is here for follow up and re-evaluation of chronic medical conditions, medication management and review of any available recent lab and radiology data.  Preventive health is updated, specifically  Cancer screening and Immunization.   Questions or concerns regarding consultations or procedures which the PT has had in the interim are  addressed. The PT denies any adverse reactions to current medications since the last visit.  Wants medication to help with weight loss Phenetermine not helpingwith weight loss or energy, drinking a lot of energy drinks, an has recently ahd bacon, blood pressure is high   ROS Denies recent fever or chills. Denies sinus pressure, nasal congestion, ear pain or sore throat. Denies chest congestion, productive cough or wheezing. Denies chest pains, palpitations and leg swelling Denies abdominal pain, nausea, vomiting,diarrhea or constipation.   Denies dysuria, frequency, hesitancy or incontinence. Denies joint pain, swelling and limitation in mobility. Denies headaches, seizures, numbness, or tingling. Denies depression, anxiety or insomnia. Denies skin break down or rash.   PE  BP (!) 150/70 (BP Location: Right Arm, Patient Position: Sitting, Cuff Size: Large)   Pulse 66   Ht '5\' 3"'$  (1.6 m)   Wt 181 lb 0.6 oz (82.1 kg)   SpO2 97%   BMI 32.07 kg/m   Patient alert and oriented and in no cardiopulmonary distress.  HEENT: No facial asymmetry, EOMI,     Neck supple .  Chest: Clear to auscultation bilaterally.  CVS: S1, S2 no murmurs, no S3.Regular rate.  ABD: Soft non tender.   Ext: No edema  MS: Adequate ROM spine, shoulders, hips and knees.  Skin: Intact, no ulcerations or rash noted.  Psych: Good eye contact, normal affect. Memory intact not anxious or depressed appearing.  CNS: CN 2-12 intact, power,  normal throughout.no focal deficits noted.   Assessment &  Plan  Essential hypertension Uncontrolled, no med change as gebenerally controled, reduce salt and canned  Foods DASH diet and commitment to daily physical activity for a minimum of 30 minutes discussed and encouraged, as a part of hypertension management. The importance of attaining a healthy weight is also discussed.     11/10/2021    1:30 PM 11/10/2021    1:29 PM 07/07/2021    1:08 PM 06/10/2021    8:33 AM 05/26/2021    8:50 AM 05/20/2021    1:46 PM 04/07/2021   10:27 AM  BP/Weight  Systolic BP 937 169 678 938 101 751 025  Diastolic BP 70 84 91 80 80 86 83  Wt. (Lbs)  181.04 181.04 185.2 185 184.12 182.12  BMI  32.07 kg/m2 32.07 kg/m2 32.81 kg/m2 32.77 kg/m2 32.62 kg/m2 32.26 kg/m2       Obesity (BMI 30.0-34.9)  Patient re-educated about  the importance of commitment to a  minimum of 150 minutes of exercise per week as able.  The importance of healthy food choices with portion control discussed, as well as eating regularly and within a 12 hour window most days. The need to choose "clean , green" food 50 to 75% of the time is discussed, as well as to make water the primary drink and set a goal of 64 ounces water daily.       11/10/2021    1:29 PM 07/07/2021    1:08 PM 06/10/2021    8:33 AM  Weight /BMI  Weight 181 lb 0.6 oz 181 lb  0.6 oz 185 lb 3.2 oz  Height '5\' 3"'$  (1.6 m) '5\' 3"'$  (1.6 m) '5\' 3"'$  (1.6 m)  BMI 32.07 kg/m2 32.07 kg/m2 32.81 kg/m2   start wegovy   NICOTINE ADDICTION Asked:confirms currently smokes cigarettes 3 to 5 / day Assess: Unwilling to set a quit date, but is cutting back Advise: needs to QUIT to reduce risk of cancer, cardio and cerebrovascular disease Assist: counseled for 5 minutes and literature provided Arrange: follow up in 2 to 4 months   Muscle cramps C/o leg cramps in the morning on awakening, check potassium and magnesium levels Need to start stretching exercises, also tonic water smail amt and mustard

## 2021-11-11 LAB — BMP8+EGFR
BUN/Creatinine Ratio: 21 (ref 12–28)
BUN: 44 mg/dL — ABNORMAL HIGH (ref 8–27)
CO2: 25 mmol/L (ref 20–29)
Calcium: 10.4 mg/dL — ABNORMAL HIGH (ref 8.7–10.3)
Chloride: 105 mmol/L (ref 96–106)
Creatinine, Ser: 2.09 mg/dL — ABNORMAL HIGH (ref 0.57–1.00)
Glucose: 99 mg/dL (ref 70–99)
Potassium: 3.8 mmol/L (ref 3.5–5.2)
Sodium: 143 mmol/L (ref 134–144)
eGFR: 26 mL/min/{1.73_m2} — ABNORMAL LOW (ref >59)

## 2021-11-11 LAB — MAGNESIUM: Magnesium: 2 mg/dL (ref 1.6–2.3)

## 2021-11-19 ENCOUNTER — Encounter: Payer: Self-pay | Admitting: Family Medicine

## 2021-11-25 NOTE — Telephone Encounter (Signed)
Paper in Dr Camillia Herter box to be signed

## 2021-11-26 ENCOUNTER — Telehealth: Payer: Self-pay | Admitting: Family Medicine

## 2021-11-26 NOTE — Telephone Encounter (Signed)
Patient aware.

## 2021-11-26 NOTE — Telephone Encounter (Signed)
Patient called in stating she needs prescription sent in. Patient did not specify in which prescription she needs, say that the provider should know what she is talking about.  Wants a call back when sent.

## 2021-11-26 NOTE — Telephone Encounter (Signed)
Patient unable to find wegovy due to back order she has found [facility] is making there own compounded version she can pay cash for I have the forms at my desk would you be willing to sign off on this ?

## 2021-11-30 ENCOUNTER — Inpatient Hospital Stay (HOSPITAL_COMMUNITY): Admission: RE | Admit: 2021-11-30 | Payer: 59 | Source: Ambulatory Visit

## 2021-11-30 ENCOUNTER — Encounter (HOSPITAL_COMMUNITY): Payer: Self-pay

## 2022-01-18 ENCOUNTER — Encounter (INDEPENDENT_AMBULATORY_CARE_PROVIDER_SITE_OTHER): Payer: Self-pay | Admitting: *Deleted

## 2022-03-15 ENCOUNTER — Ambulatory Visit (HOSPITAL_COMMUNITY)
Admission: RE | Admit: 2022-03-15 | Discharge: 2022-03-15 | Disposition: A | Discharge status: Home / Self Care | Payer: 59 | Source: Ambulatory Visit | Attending: Family Medicine | Admitting: Family Medicine

## 2022-03-15 DIAGNOSIS — Z1231 Encounter for screening mammogram for malignant neoplasm of breast: Secondary | ICD-10-CM | POA: Insufficient documentation

## 2022-03-16 ENCOUNTER — Other Ambulatory Visit: Payer: Self-pay | Admitting: Family Medicine

## 2022-03-25 ENCOUNTER — Other Ambulatory Visit: Payer: Self-pay | Admitting: Family Medicine

## 2022-03-31 ENCOUNTER — Encounter: Payer: Self-pay | Admitting: Family Medicine

## 2022-03-31 ENCOUNTER — Ambulatory Visit: Payer: Medicare HMO | Admitting: Family Medicine

## 2022-03-31 VITALS — BP 160/90 | HR 69 | Ht 63.0 in | Wt 180.0 lb

## 2022-03-31 DIAGNOSIS — Z1211 Encounter for screening for malignant neoplasm of colon: Secondary | ICD-10-CM | POA: Diagnosis not present

## 2022-03-31 DIAGNOSIS — Z23 Encounter for immunization: Secondary | ICD-10-CM | POA: Diagnosis not present

## 2022-03-31 DIAGNOSIS — Z0000 Encounter for general adult medical examination without abnormal findings: Secondary | ICD-10-CM | POA: Diagnosis not present

## 2022-03-31 DIAGNOSIS — Z780 Asymptomatic menopausal state: Secondary | ICD-10-CM

## 2022-03-31 NOTE — Progress Notes (Signed)
Preventive Screening-Counseling & Management   Patient present here today for a welcome to Medicare visitr   Current Problems (verified)   Medications Prior to Visit Allergies (verified)   PAST HISTORY  Family History  Social History  Married, mother of one living daughter , current smoker   Risk Factors  Current exercise habits:  walk daily x 20 mins  Dietary issues discussed:heart healthy  diet    Cardiac risk factors: smoker, hypertension  Depression Screen  (Note: if answer to either of the following is "Yes", a more complete depression screening is indicated)   Over the past two weeks, have you felt down, depressed or hopeless? No  Over the past two weeks, have you felt little interest or pleasure in doing things? No  Have you lost interest or pleasure in daily life? No  Do you often feel hopeless? No  Do you cry easily over simple problems? No   Activities of Daily Living  In your present state of health, do you have any difficulty performing the following activities?  Driving?: No Managing money?: No Feeding yourself?:No Getting from bed to chair?:No Climbing a flight of stairs?:No Preparing food and eating?:No Bathing or showering?:No Getting dressed?:No Getting to the toilet?:No Using the toilet?:No Moving around from place to place?: No  Fall Risk Assessment In the past year have you fallen or had a near fall?:No Are you currently taking any medications that make you dizy?:No   Hearing Difficulties: No Do you often ask people to speak up or repeat themselves?:No Do you experience ringing or noises in your ears?:No Do you have difficulty understanding soft or whispered voices?:No  Cognitive Testing  Alert? Yes Normal Appearance?Yes  Oriented to person? Yes Place? Yes  Time? Yes  Displays appropriate judgment?Yes  Can read the correct time from a watch face? yes Are you having problems remembering things?No  Advanced Directives have been  discussed with the patient?Yes    List the Names of Other Physician/Practitioners you currently use:  Dr Marylene Buerger  Indicate any recent Medical Services you may have received from other than [facility] providers in the past year (date may be approximate).     Medicare Attestation  I have personally reviewed:  The patient's medical and social history  Their use of alcohol, tobacco or illicit drugs  Their current medications and supplements  The patient's functional ability including ADLs,fall risks, home safety risks, cognitive, and hearing and visual impairment  Diet and physical activities  Evidence for depression or mood disorders  The patient's weight, height, BMI, and visual acuity have been recorded in the chart. I have made referrals, counseling, and provided education to the patient based on review of the above and I have provided the patient with a written personalized care plan for preventive services.    Physical Exam BP (!) 160/90   Pulse 69   Ht '5\' 3"'$  (1.6 m)   Wt 180 lb 0.6 oz (81.7 kg)   SpO2 98%   BMI 31.89 kg/m    Assessment & Plan:  Welcome to Medicare preventive visit Welcome to medicare  as documented.   Changes in health habits are decided on by the patient with goals and time frames  set for achieving them. Immunization and cancer screening needs are specifically addressed at this visit.

## 2022-03-31 NOTE — Patient Instructions (Signed)
F/u in 3.5 months, re evaluate blood pressure and discuss living will, call if you need me sooner  Pneumonia 20 today  Please arrange annual lung cancer screening   Dexa to be scheduled at checkout  You are referred to Dr Eula Listen for colonoscopy  Please get the RSV vaccine at your pharmacy  You are referred to Dr Aline Brochure re bilateral leg and knee painfor past 3 months   Fasting CBC, lipid, cmp and eGFr, TSH and vit d 3 to 5 days before next appointment  Plan to qUIT smoking please  Thanks for choosing [facility], we consider it a privelige to serve you.

## 2022-04-01 ENCOUNTER — Encounter: Payer: Self-pay | Admitting: Family Medicine

## 2022-04-01 ENCOUNTER — Encounter (INDEPENDENT_AMBULATORY_CARE_PROVIDER_SITE_OTHER): Payer: Self-pay | Admitting: *Deleted

## 2022-04-04 ENCOUNTER — Encounter: Payer: Self-pay | Admitting: Family Medicine

## 2022-04-04 DIAGNOSIS — Z0000 Encounter for general adult medical examination without abnormal findings: Secondary | ICD-10-CM | POA: Insufficient documentation

## 2022-04-04 NOTE — Assessment & Plan Note (Addendum)
Welcome to [employer]  as documented.   Changes in health habits are decided on by the patient with goals and time frames  set for achieving them. Immunization and cancer screening needs are specifically addressed at this visit.

## 2022-04-06 ENCOUNTER — Ambulatory Visit (HOSPITAL_COMMUNITY)
Admission: RE | Admit: 2022-04-06 | Discharge: 2022-04-06 | Disposition: A | Discharge status: Home / Self Care | Payer: Medicare HMO | Source: Ambulatory Visit | Attending: Family Medicine | Admitting: Family Medicine

## 2022-04-06 DIAGNOSIS — Z780 Asymptomatic menopausal state: Secondary | ICD-10-CM | POA: Insufficient documentation

## 2022-07-13 ENCOUNTER — Ambulatory Visit (INDEPENDENT_AMBULATORY_CARE_PROVIDER_SITE_OTHER): Payer: Medicare HMO | Admitting: Family Medicine

## 2022-07-13 ENCOUNTER — Encounter: Payer: Self-pay | Admitting: Family Medicine

## 2022-07-13 VITALS — BP 150/88 | HR 69 | Ht 63.0 in | Wt 181.0 lb

## 2022-07-13 DIAGNOSIS — R5383 Other fatigue: Secondary | ICD-10-CM

## 2022-07-13 DIAGNOSIS — E785 Hyperlipidemia, unspecified: Secondary | ICD-10-CM | POA: Diagnosis not present

## 2022-07-13 DIAGNOSIS — F17200 Nicotine dependence, unspecified, uncomplicated: Secondary | ICD-10-CM

## 2022-07-13 DIAGNOSIS — I129 Hypertensive chronic kidney disease with stage 1 through stage 4 chronic kidney disease, or unspecified chronic kidney disease: Secondary | ICD-10-CM

## 2022-07-13 DIAGNOSIS — N184 Chronic kidney disease, stage 4 (severe): Secondary | ICD-10-CM

## 2022-07-13 DIAGNOSIS — E669 Obesity, unspecified: Secondary | ICD-10-CM

## 2022-07-13 DIAGNOSIS — E559 Vitamin D deficiency, unspecified: Secondary | ICD-10-CM

## 2022-07-13 DIAGNOSIS — E8881 Metabolic syndrome: Secondary | ICD-10-CM

## 2022-07-13 DIAGNOSIS — I10 Essential (primary) hypertension: Secondary | ICD-10-CM

## 2022-07-13 NOTE — Patient Instructions (Signed)
Annual exam and re  evaluate blood pressure  NEED to take carvedilol TWO times daily as prescribed,   Please cut back and stop smoking , keep working on it  It is important that you exercise regularly at least 30 minutes 5 times a week. If you develop chest pain, have severe difficulty breathing, or feel very tired, stop exercising immediately and seek medical attention , increase to 3 days / week   TSH, vit D, lipid, hepatic panel today  Will be able to start phentermine when BP is controlled   Please work on getting colonoscopy  Thanks for choosing [facility], we consider it a privelige to serve you.

## 2022-07-14 LAB — LIPID PANEL
Chol/HDL Ratio: 2.7 ratio (ref 0.0–4.4)
Cholesterol, Total: 211 mg/dL — ABNORMAL HIGH (ref 100–199)
HDL: 77 mg/dL (ref >39)
LDL Chol Calc (NIH): 114 mg/dL — ABNORMAL HIGH (ref 0–99)
Triglycerides: 116 mg/dL (ref 0–149)
VLDL Cholesterol Cal: 20 mg/dL (ref 5–40)

## 2022-07-14 LAB — HEPATIC FUNCTION PANEL
ALT: 19 IU/L (ref 0–32)
AST: 22 IU/L (ref 0–40)
Albumin: 4.3 g/dL (ref 3.9–4.9)
Alkaline Phosphatase: 113 IU/L (ref 44–121)
Bilirubin Total: 0.2 mg/dL (ref 0.0–1.2)
Bilirubin, Direct: <0.1 mg/dL (ref 0.00–0.40)
Total Protein: 6.7 g/dL (ref 6.0–8.5)

## 2022-07-14 LAB — TSH: TSH: 1.64 u[IU]/mL (ref 0.450–4.500)

## 2022-07-14 LAB — VITAMIN D 25 HYDROXY (VIT D DEFICIENCY, FRACTURES): Vit D, 25-Hydroxy: 27.7 ng/mL — ABNORMAL LOW (ref 30.0–100.0)

## 2022-07-15 ENCOUNTER — Encounter: Payer: Self-pay | Admitting: Family Medicine

## 2022-07-15 NOTE — Progress Notes (Signed)
Christina Williamson     MRN: [id number]      DOB: 04/13/1955  Chief Complaint  Patient presents with   Hypertension    Follow up    HPI Christina Williamson is here for follow up and re-evaluation of chronic medical conditions, medication management and review of any available recent lab and radiology data.  Preventive health is updated, specifically  Cancer screening and Immunization.   Needs colonoscopy and chest scan C/O inability  to lose weight ,wants medication, still smoking and needs to quit, not ready smokes 3/day  ROS Denies recent fever or chills. Denies sinus pressure, nasal congestion, ear pain or sore throat. Denies chest congestion, productive cough or wheezing. Denies chest pains, palpitations and leg swelling Denies abdominal pain, nausea, vomiting,diarrhea or constipation.   Denies dysuria, frequency, hesitancy or incontinence. Denies joint pain, swelling and limitation in mobility. Denies headaches, seizures, numbness, or tingling. Denies depression, anxiety or insomnia. Denies skin break down or rash.   PE  BP (!) 150/88   Pulse 69   Ht 5\' 3"  (1.6 m)   Wt 181 lb 0.6 oz (82.1 kg)   SpO2 94%   BMI 32.07 kg/m   Patient alert and oriented and in no cardiopulmonary distress.  HEENT: No facial asymmetry, EOMI,     Neck supple .  Chest: Clear to auscultation bilaterally.Decreased air entry throughout  CVS: S1, S2 no murmurs, no S3.Regular rate.  ABD: Soft non tender.   Ext: No edema  MS: Adequate ROM spine, shoulders, hips and knees.  Skin: Intact, no ulcerations or rash noted.  Psych: Good eye contact, normal affect. Memory intact not anxious or depressed appearing.  CNS: CN 2-12 intact, power,  normal throughout.no focal deficits noted.   Assessment & Plan  Essential hypertension Uncontrolled not taking medication as prescribed DASH diet and commitment to daily physical activity for a minimum of 30 minutes discussed and encouraged, as a part of  hypertension management. The importance of attaining a healthy weight is also discussed.     07/13/2022   11:18 AM 07/13/2022   11:02 AM 03/31/2022   11:19 AM 03/31/2022   10:05 AM 03/31/2022    9:58 AM 11/10/2021    1:30 PM 11/10/2021    1:29 PM  BP/Weight  Systolic BP 150 165 160 136 157 150 152  Diastolic BP 88 94 90 86 98 70 84  Wt. (Lbs)  181.04   180.04  181.04  BMI  32.07 kg/m2   31.89 kg/m2  32.07 kg/m2       Obesity (BMI 30.0-34.9) Unchanged, will start phentermine when BP controlled Patient re-educated about  the importance of commitment to a  minimum of 150 minutes of exercise per week as able.  The importance of healthy food choices with portion control discussed, as well as eating regularly and within a 12 hour window most days. The need to choose "clean , green" food 50 to 75% of the time is discussed, as well as to make water the primary drink and set a goal of 64 ounces water daily.       07/13/2022   11:02 AM 03/31/2022    9:58 AM 11/10/2021    1:29 PM  Weight /BMI  Weight 181 lb 0.6 oz 180 lb 0.6 oz 181 lb 0.6 oz  Height 5\' 3"  (1.6 m) 5\' 3"  (1.6 m) 5\' 3"  (1.6 m)  BMI 32.07 kg/m2 31.89 kg/m2 32.07 kg/m2      NICOTINE ADDICTION Asked:confirms currently smokes  cigarettes on avg 3/day Assess: Unwilling to set a quit date,  Advise: needs to QUIT to reduce risk of cancer, cardio and cerebrovascular disease Assist: counseled for 5 minutes and literature provided Arrange: follow up in 2 to 4 months   Dyslipidemia Hyperlipidemia:Low fat diet discussed and encouraged.   Lipid Panel  Lab Results  Component Value Date   CHOL 211 (H) 07/13/2022   HDL 77 07/13/2022   LDLCALC 114 (H) 07/13/2022   TRIG 116 07/13/2022   CHOLHDL 2.7 07/13/2022     Needs to reduce fried and fatty foods  Metabolic syndrome X The increased risk of cardiovascular disease associated with this diagnosis, and the need to consistently work on lifestyle to change this is  discussed. Following  a  heart healthy diet ,commitment to 30 minutes of exercise at least 5 days per week, as well as control of blood sugar and cholesterol , and achieving a healthy weight are all the areas to be addressed .   CKD (chronic kidney disease) stage 4, GFR 15-29 ml/min (HCC) Monitored by Nephrology

## 2022-07-15 NOTE — Assessment & Plan Note (Addendum)
Unchanged, will start phentermine when BP controlled Patient re-educated about  the importance of commitment to a  minimum of 150 minutes of exercise per week as able.  The importance of healthy food choices with portion control discussed, as well as eating regularly and within a 12 hour window most days. The need to choose "clean , green" food 50 to 75% of the time is discussed, as well as to make water the primary drink and set a goal of 64 ounces water daily.       07/13/2022   11:02 AM 03/31/2022    9:58 AM 11/10/2021    1:29 PM  Weight /BMI  Weight 181 lb 0.6 oz 180 lb 0.6 oz 181 lb 0.6 oz  Height 5\' 3"  (1.6 m) 5\' 3"  (1.6 m) 5\' 3"  (1.6 m)  BMI 32.07 kg/m2 31.89 kg/m2 32.07 kg/m2

## 2022-07-15 NOTE — Assessment & Plan Note (Addendum)
Uncontrolled not taking medication as prescribed DASH diet and commitment to daily physical activity for a minimum of 30 minutes discussed and encouraged, as a part of hypertension management. The importance of attaining a healthy weight is also discussed.     07/13/2022   11:18 AM 07/13/2022   11:02 AM 03/31/2022   11:19 AM 03/31/2022   10:05 AM 03/31/2022    9:58 AM 11/10/2021    1:30 PM 11/10/2021    1:29 PM  BP/Weight  Systolic BP 150 165 160 136 157 150 152  Diastolic BP 88 94 90 86 98 70 84  Wt. (Lbs)  181.04   180.04  181.04  BMI  32.07 kg/m2   31.89 kg/m2  32.07 kg/m2

## 2022-07-18 DIAGNOSIS — N184 Chronic kidney disease, stage 4 (severe): Secondary | ICD-10-CM | POA: Insufficient documentation

## 2022-07-18 NOTE — Assessment & Plan Note (Signed)
Monitored by Nephrology.

## 2022-07-18 NOTE — Assessment & Plan Note (Signed)
The increased risk of cardiovascular disease associated with this diagnosis, and the need to consistently work on lifestyle to change this is discussed. Following  a  heart healthy diet ,commitment to 30 minutes of exercise at least 5 days per week, as well as control of blood sugar and cholesterol , and achieving a healthy weight are all the areas to be addressed .  

## 2022-07-18 NOTE — Assessment & Plan Note (Signed)
Asked:confirms currently smokes cigarettes on avg 3/day Assess: Unwilling to set a quit date,  Advise: needs to QUIT to reduce risk of cancer, cardio and cerebrovascular disease Assist: counseled for 5 minutes and literature provided Arrange: follow up in 2 to 4 months

## 2022-07-18 NOTE — Assessment & Plan Note (Signed)
Hyperlipidemia:Low fat diet discussed and encouraged.   Lipid Panel  Lab Results  Component Value Date   CHOL 211 (H) 07/13/2022   HDL 77 07/13/2022   LDLCALC 114 (H) 07/13/2022   TRIG 116 07/13/2022   CHOLHDL 2.7 07/13/2022     Needs to reduce fried and fatty foods

## 2022-08-24 ENCOUNTER — Telehealth: Payer: Self-pay | Admitting: Family Medicine

## 2022-08-24 NOTE — Telephone Encounter (Signed)
FYI, provider schedule overbooked should have been CPE, called patient, patient blood pressure is doing good and said will reschedule CPE to August.

## 2022-08-25 ENCOUNTER — Ambulatory Visit: Payer: Medicare HMO | Admitting: Family Medicine

## 2022-08-31 ENCOUNTER — Encounter (INDEPENDENT_AMBULATORY_CARE_PROVIDER_SITE_OTHER): Payer: Self-pay | Admitting: *Deleted

## 2022-10-05 ENCOUNTER — Encounter: Payer: Self-pay | Admitting: Family Medicine

## 2022-10-05 ENCOUNTER — Telehealth: Payer: Self-pay | Admitting: Family Medicine

## 2022-10-05 ENCOUNTER — Ambulatory Visit (INDEPENDENT_AMBULATORY_CARE_PROVIDER_SITE_OTHER): Payer: Medicare HMO | Admitting: Family Medicine

## 2022-10-05 VITALS — BP 148/82 | HR 86 | Ht 63.0 in | Wt 182.0 lb

## 2022-10-05 DIAGNOSIS — I129 Hypertensive chronic kidney disease with stage 1 through stage 4 chronic kidney disease, or unspecified chronic kidney disease: Secondary | ICD-10-CM

## 2022-10-05 DIAGNOSIS — F17200 Nicotine dependence, unspecified, uncomplicated: Secondary | ICD-10-CM | POA: Diagnosis not present

## 2022-10-05 DIAGNOSIS — E669 Obesity, unspecified: Secondary | ICD-10-CM

## 2022-10-05 DIAGNOSIS — F17218 Nicotine dependence, cigarettes, with other nicotine-induced disorders: Secondary | ICD-10-CM

## 2022-10-05 DIAGNOSIS — Z0001 Encounter for general adult medical examination with abnormal findings: Secondary | ICD-10-CM | POA: Diagnosis not present

## 2022-10-05 DIAGNOSIS — D126 Benign neoplasm of colon, unspecified: Secondary | ICD-10-CM

## 2022-10-05 DIAGNOSIS — F320 Major depressive disorder, single episode, mild: Secondary | ICD-10-CM

## 2022-10-05 MED ORDER — VENLAFAXINE HCL ER 37.5 MG PO CP24: 37.5000 mg | ORAL_CAPSULE | Freq: Every day | ORAL | 2 refills | Status: DC

## 2022-10-05 MED ORDER — CARVEDILOL 6.25 MG PO TABS: ORAL_TABLET | ORAL | 3 refills | Status: DC

## 2022-10-05 NOTE — Telephone Encounter (Signed)
Patient called left a voicemail asked for nurse to return her call questions about her blood pressure medicine.

## 2022-10-05 NOTE — Patient Instructions (Signed)
F./U in 7 to 8 weeks , re evaluate blood pressure and depression , call if you need me sooner  Increase dose of carvedilol to one and a half tabs twixce daily  I will try to get 1 month of wegovy for you   Need colonoscopy  New for depression is effexor once daily every morning  Work on stopping smoking  You are being referred for chest scan for lung cancer screening \ Thanks for choosing [facility], we consider it a privelige to serve you.

## 2022-10-07 DIAGNOSIS — Z0001 Encounter for general adult medical examination with abnormal findings: Secondary | ICD-10-CM | POA: Insufficient documentation

## 2022-10-07 DIAGNOSIS — F320 Major depressive disorder, single episode, mild: Secondary | ICD-10-CM | POA: Insufficient documentation

## 2022-10-07 NOTE — Assessment & Plan Note (Signed)
Asked:confirms currently smokes cigarettes Assess: Unwilling to set a quit date, but is cutting back Advise: needs to QUIT to reduce risk of cancer, cardio and cerebrovascular disease Assist: counseled for 5 minutes and literature provided Arrange: follow up in 2 to 4 months Needs chest scan for screening

## 2022-10-07 NOTE — Assessment & Plan Note (Signed)
Annual exam as documented. Counseling done  re healthy lifestyle involving commitment to 150 minutes exercise per week, heart healthy diet, and attaining healthy weight.The importance of adequate sleep also discussed.  Immunization and cancer screening needs are specifically addressed at this visit.  

## 2022-10-07 NOTE — Assessment & Plan Note (Signed)
  Patient re-educated about  the importance of commitment to a  minimum of 150 minutes of exercise per week as able.  The importance of healthy food choices with portion control discussed, as well as eating regularly and within a 12 hour window most days. The need to choose "clean , green" food 50 to 75% of the time is discussed, as well as to make water the primary drink and set a goal of 64 ounces water daily.       10/05/2022    1:35 PM 07/13/2022   11:02 AM 03/31/2022    9:58 AM  Weight /BMI  Weight 182 lb 0.6 oz 181 lb 0.6 oz 180 lb 0.6 oz  Height 5\' 3"  (1.6 m) 5\' 3"  (1.6 m) 5\' 3"  (1.6 m)  BMI 32.25 kg/m2 32.07 kg/m2 31.89 kg/m2

## 2022-10-07 NOTE — Assessment & Plan Note (Signed)
Start  effexor 37.5 mg daily and re eval in 7 weeks

## 2022-10-07 NOTE — Assessment & Plan Note (Signed)
Uncontrolled and not at goal, med adjustment to be made will also need to communicate with Nephrologist DASH diet and commitment to daily physical activity for a minimum of 30 minutes discussed and encouraged, as a part of hypertension management. The importance of attaining a healthy weight is also discussed.     10/05/2022    2:09 PM 10/05/2022    1:36 PM 10/05/2022    1:35 PM 07/13/2022   11:18 AM 07/13/2022   11:02 AM 03/31/2022   11:19 AM 03/31/2022   10:05 AM  BP/Weight  Systolic BP 148 141 148 150 165 160 136  Diastolic BP 82 88 88 88 94 90 86  Wt. (Lbs)   182.04  181.04    BMI   32.25 kg/m2  32.07 kg/m2

## 2022-10-07 NOTE — Progress Notes (Signed)
Christina Williamson     MRN: [id number]      DOB: 22-Aug-1955  Chief Complaint  Patient presents with   Annual Exam    CPE requesting wegovy    HPI: Patient is in for annual physical exam. Recent labs,  are reviewed. Immunization is reviewed , and  updated if needed.   PE: BP (!) 148/82   Pulse 86   Ht 5\' 3"  (1.6 m)   Wt 182 lb 0.6 oz (82.6 kg)   SpO2 98%   BMI 32.25 kg/m   Pleasant  female, alert and oriented x 3, in no cardio-pulmonary distress. Afebrile. HEENT No facial trauma or asymetry. Sinuses non tender.  Extra occullar muscles intact.. External ears normal, . Neck: supple, no adenopathy,JVD or thyromegaly.No bruits.  Chest: Clear to ascultation bilaterally.No crackles or wheezes. Non tender to palpation  Breast: Not examined  Cardiovascular system; Heart sounds normal,  S1 and  S2 ,no S3.  No murmur, or thrill.  Peripheral pulses normal.  Abdomen: Soft, non tender   Musculoskeletal exam: Full ROM of spine, hips , shoulders and knees. Neurologic: Cranial nerves 2 to 12 intact. Power, tone ,sensation  normal throughout. No disturbance in gait. No tremor.  Skin: Intact, no ulceration, erythema , scaling or rash noted. Pigmentation normal throughout  Psych; Readily tearful with poor self esteem, PHQ 9 score of 9. Judgement and concentration normal. Not suicidal or homicidal   Assessment & Plan:  Annual visit for general adult medical examination with abnormal findings Annual exam as documented. Counseling done  re healthy lifestyle involving commitment to 150 minutes exercise per week, heart healthy diet, and attaining healthy weight.The importance of adequate sleep also discussed.  Immunization and cancer screening needs are specifically addressed at this visit.   Malignant hypertension (arteriolar nephrosclerosis), stage 1-4 or unspecified chronic kidney disease Uncontrolled and not at goal, med adjustment to be made will also need to  communicate with Nephrologist DASH diet and commitment to daily physical activity for a minimum of 30 minutes discussed and encouraged, as a part of hypertension management. The importance of attaining a healthy weight is also discussed.     10/05/2022    2:09 PM 10/05/2022    1:36 PM 10/05/2022    1:35 PM 07/13/2022   11:18 AM 07/13/2022   11:02 AM 03/31/2022   11:19 AM 03/31/2022   10:05 AM  BP/Weight  Systolic BP 148 141 148 150 165 160 136  Diastolic BP 82 88 88 88 94 90 86  Wt. (Lbs)   182.04  181.04    BMI   32.25 kg/m2  32.07 kg/m2         NICOTINE ADDICTION Asked:confirms currently smokes cigarettes Assess: Unwilling to set a quit date, but is cutting back Advise: needs to QUIT to reduce risk of cancer, cardio and cerebrovascular disease Assist: counseled for 5 minutes and literature provided Arrange: follow up in 2 to 4 months Needs chest scan for screening  Tubular adenoma of colon Colonoscopy due , needs to follow through with this  Obesity (BMI 30.0-34.9)  Patient re-educated about  the importance of commitment to a  minimum of 150 minutes of exercise per week as able.  The importance of healthy food choices with portion control discussed, as well as eating regularly and within a 12 hour window most days. The need to choose "clean , green" food 50 to 75% of the time is discussed, as well as to make water the primary drink  and set a goal of 64 ounces water daily.       10/05/2022    1:35 PM 07/13/2022   11:02 AM 03/31/2022    9:58 AM  Weight /BMI  Weight 182 lb 0.6 oz 181 lb 0.6 oz 180 lb 0.6 oz  Height 5\' 3"  (1.6 m) 5\' 3"  (1.6 m) 5\' 3"  (1.6 m)  BMI 32.25 kg/m2 32.07 kg/m2 31.89 kg/m2      Depression, major, single episode, mild (HCC) Start  effexor 37.5 mg daily and re eval in 7 weeks

## 2022-10-07 NOTE — Assessment & Plan Note (Signed)
Colonoscopy due , needs to follow through with this

## 2022-10-08 ENCOUNTER — Other Ambulatory Visit: Payer: Self-pay | Admitting: Family Medicine

## 2022-10-11 ENCOUNTER — Encounter: Payer: Self-pay | Admitting: Family Medicine

## 2022-10-11 ENCOUNTER — Encounter (INDEPENDENT_AMBULATORY_CARE_PROVIDER_SITE_OTHER): Payer: Self-pay | Admitting: *Deleted

## 2022-10-13 ENCOUNTER — Other Ambulatory Visit: Payer: Self-pay

## 2022-10-13 MED ORDER — WEGOVY 0.25 MG/0.5ML ~~LOC~~ SOAJ: 0.2500 mg | SUBCUTANEOUS | Status: DC

## 2022-12-02 ENCOUNTER — Other Ambulatory Visit: Payer: Self-pay | Admitting: Family Medicine

## 2022-12-07 ENCOUNTER — Ambulatory Visit: Payer: Medicare HMO | Admitting: Family Medicine

## 2023-01-11 ENCOUNTER — Encounter: Payer: Self-pay | Admitting: Family Medicine

## 2023-01-11 ENCOUNTER — Ambulatory Visit (INDEPENDENT_AMBULATORY_CARE_PROVIDER_SITE_OTHER): Payer: Medicare HMO | Admitting: Family Medicine

## 2023-01-11 VITALS — BP 156/94 | HR 86 | Ht 63.0 in | Wt 175.1 lb

## 2023-01-11 DIAGNOSIS — D126 Benign neoplasm of colon, unspecified: Secondary | ICD-10-CM

## 2023-01-11 DIAGNOSIS — N184 Chronic kidney disease, stage 4 (severe): Secondary | ICD-10-CM

## 2023-01-11 DIAGNOSIS — E66811 Obesity, class 1: Secondary | ICD-10-CM

## 2023-01-11 DIAGNOSIS — I129 Hypertensive chronic kidney disease with stage 1 through stage 4 chronic kidney disease, or unspecified chronic kidney disease: Secondary | ICD-10-CM | POA: Diagnosis not present

## 2023-01-11 DIAGNOSIS — E785 Hyperlipidemia, unspecified: Secondary | ICD-10-CM | POA: Diagnosis not present

## 2023-01-11 DIAGNOSIS — E559 Vitamin D deficiency, unspecified: Secondary | ICD-10-CM | POA: Diagnosis not present

## 2023-01-11 DIAGNOSIS — F320 Major depressive disorder, single episode, mild: Secondary | ICD-10-CM

## 2023-01-11 DIAGNOSIS — R252 Cramp and spasm: Secondary | ICD-10-CM

## 2023-01-11 DIAGNOSIS — F17200 Nicotine dependence, unspecified, uncomplicated: Secondary | ICD-10-CM

## 2023-01-11 DIAGNOSIS — Z1231 Encounter for screening mammogram for malignant neoplasm of breast: Secondary | ICD-10-CM

## 2023-01-11 NOTE — Assessment & Plan Note (Signed)
Uncontrolled, increase hydralazine to three times daily, will f/u wiith Nephrology DASH diet and commitment to daily physical activity for a minimum of 30 minutes discussed and encouraged, as a part of hypertension management. The importance of attaining a healthy weight is also discussed.     01/11/2023    3:23 PM 01/11/2023    2:46 PM 01/11/2023    2:44 PM 10/05/2022    2:09 PM 10/05/2022    1:36 PM 10/05/2022    1:35 PM 07/13/2022   11:18 AM  BP/Weight  Systolic BP 156 156 147 148 141 148 150  Diastolic BP 94 87 100 82 88 88 88  Wt. (Lbs)   175.08   182.04   BMI   31.01 kg/m2   32.25 kg/m2      '

## 2023-01-11 NOTE — Assessment & Plan Note (Signed)
Hyperlipidemia:Low fat diet discussed and encouraged.   Lipid Panel  Lab Results  Component Value Date   CHOL 211 (H) 07/13/2022   HDL 77 07/13/2022   LDLCALC 114 (H) 07/13/2022   TRIG 116 07/13/2022   CHOLHDL 2.7 07/13/2022     Updated lab needed at/ before next visit.

## 2023-01-11 NOTE — Assessment & Plan Note (Signed)
Trial of quinine check Mg level

## 2023-01-11 NOTE — Assessment & Plan Note (Signed)
Resolved and on no medication, felt good for a while then started " feeling  strange"

## 2023-01-11 NOTE — Assessment & Plan Note (Signed)
Improved  Patient re-educated about  the importance of commitment to a  minimum of 150 minutes of exercise per week as able.  The importance of healthy food choices with portion control discussed, as well as eating regularly and within a 12 hour window most days. The need to choose "clean , green" food 50 to 75% of the time is discussed, as well as to make water the primary drink and set a goal of 64 ounces water daily.       01/11/2023    2:44 PM 10/05/2022    1:35 PM 07/13/2022   11:02 AM  Weight /BMI  Weight 175 lb 1.3 oz 182 lb 0.6 oz 181 lb 0.6 oz  Height 5\' 3"  (1.6 m) 5\' 3"  (1.6 m) 5\' 3"  (1.6 m)  BMI 31.01 kg/m2 32.25 kg/m2 32.07 kg/m2

## 2023-01-11 NOTE — Assessment & Plan Note (Signed)
Followed by Nephrology, states she is being encouraged to attend class on kidney health , no interest and will not go I advised her to d/w Nephrologist

## 2023-01-11 NOTE — Assessment & Plan Note (Signed)
Colonoscopy past due, needs to ger done

## 2023-01-11 NOTE — Progress Notes (Signed)
Christina Williamson     MRN: [id number]      DOB: [birth year]  Chief Complaint  Patient presents with   Follow-up    Follow up    HPI Christina Williamson is here for follow up and re-evaluation of chronic medical conditions, medication management and review of any available recent lab and radiology data.  Preventive health is updated, specifically  Cancer screening and Immunization.   Questions or concerns regarding consultations or procedures which the PT has had in the interim are  addressed. The PT denies any adverse reactions to current medications since the last visit.  There are no new concerns.  There are no specific complaints   ROS Denies recent fever or chills. Denies sinus pressure, nasal congestion, ear pain or sore throat. Denies chest congestion, productive cough or wheezing. Denies chest pains, palpitations and leg swelling Denies abdominal pain, nausea, vomiting,diarrhea or constipation.   Denies dysuria, frequency, hesitancy or incontinence. Denies joint pain, swelling and limitation in mobility. Denies headaches, seizures, numbness, or tingling. Denies depression, anxiety or insomnia. Denies skin break down or rash.   PE  BP (!) 156/94   Pulse 86   Ht 5\' 3"  (1.6 m)   Wt 175 lb 1.3 oz (79.4 kg)   SpO2 96%   BMI 31.01 kg/m   Patient alert and oriented and in no cardiopulmonary distress.  HEENT: No facial asymmetry, EOMI,     Neck supple .  Chest: Clear to auscultation bilaterally.  CVS: S1, S2 no murmurs, no S3.Regular rate.  ABD: Soft non tender.   Ext: No edema  MS: Adequate ROM spine, shoulders, hips and knees.  Skin: Intact, no ulcerations or rash noted.  Psych: Good eye contact, normal affect. Memory intact not anxious or depressed appearing.  CNS: CN 2-12 intact, power,  normal throughout.no focal deficits noted.   Assessment & Plan  Malignant hypertension (arteriolar nephrosclerosis), stage 1-4 or unspecified chronic kidney  disease Uncontrolled, increase hydralazine to three times daily, will f/u wiith Nephrology DASH diet and commitment to daily physical activity for a minimum of 30 minutes discussed and encouraged, as a part of hypertension management. The importance of attaining a healthy weight is also discussed.     01/11/2023    3:23 PM 01/11/2023    2:46 PM 01/11/2023    2:44 PM 10/05/2022    2:09 PM 10/05/2022    1:36 PM 10/05/2022    1:35 PM 07/13/2022   11:18 AM  BP/Weight  Systolic BP 156 156 147 148 141 148 150  Diastolic BP 94 87 100 82 88 88 88  Wt. (Lbs)   175.08   182.04   BMI   31.01 kg/m2   32.25 kg/m2      '  NICOTINE ADDICTION Asked:confirms currently smokes cigarettes Assess: Unwilling to set a quit date, but reports she is trying to cut back Advise: needs to QUIT to reduce risk of cancer, cardio and cerebrovascular disease Assist: counseled for 5 minutes and literature provided Arrange: follow up in 2 to 4 months   Muscle cramps Trial of quinine check Mg level  Obesity (BMI 30.0-34.9) Improved  Patient re-educated about  the importance of commitment to a  minimum of 150 minutes of exercise per week as able.  The importance of healthy food choices with portion control discussed, as well as eating regularly and within a 12 hour window most days. The need to choose "clean , green" food 50 to 75% of the time is discussed,  as well as to make water the primary drink and set a goal of 64 ounces water daily.       01/11/2023    2:44 PM 10/05/2022    1:35 PM 07/13/2022   11:02 AM  Weight /BMI  Weight 175 lb 1.3 oz 182 lb 0.6 oz 181 lb 0.6 oz  Height 5\' 3"  (1.6 m) 5\' 3"  (1.6 m) 5\' 3"  (1.6 m)  BMI 31.01 kg/m2 32.25 kg/m2 32.07 kg/m2      Tubular adenoma of colon Colonoscopy past due, needs to ger done  CKD (chronic kidney disease) stage 4, GFR 15-29 ml/min (HCC) Followed by Nephrology, states she is being encouraged to attend class on kidney health , no interest and will not  go I advised her to d/w Nephrologist  Depression, major, single episode, mild (HCC) Resolved and on no medication, felt good for a while then started " feeling  strange"  Dyslipidemia Hyperlipidemia:Low fat diet discussed and encouraged.   Lipid Panel  Lab Results  Component Value Date   CHOL 211 (H) 07/13/2022   HDL 77 07/13/2022   LDLCALC 114 (H) 07/13/2022   TRIG 116 07/13/2022   CHOLHDL 2.7 07/13/2022     Updated lab needed at/ before next visit.   Vitamin D deficiency Updated lab needed at/ before next visit.

## 2023-01-11 NOTE — Patient Instructions (Signed)
F/U in 18 weeks, call if you need me sooner  Increase hydralazine 50 mg to 3 times daily.  Take at 7 AM 3 PM and 11 PM.   Continue amlodipine 10 mg 1 daily chlorthalidone 25 mg 1 daily and valsartan 40 mg 1 daily as before.  Fasting lipid CMP and EGFR CBC TSH and vitamin D 3 to 5 days before next scheduled appointment in 18 weeks  Please schedule mammogram at checkout.  Nurse please give patient letter to follow through with scheduling her colonoscopy.  Please continue to work on quitting smoking as this will improve your health.  Congrats on weight loss.  Commit to regular exercise to strengthen bones and protect heart as well as improve overall wellbeing.  Best for 2025.

## 2023-01-11 NOTE — Assessment & Plan Note (Signed)
Asked:confirms currently smokes cigarettes Assess: Unwilling to set a quit date, but reports she is trying to cut back Advise: needs to QUIT to reduce risk of cancer, cardio and cerebrovascular disease Assist: counseled for 5 minutes and literature provided Arrange: follow up in 2 to 4 months

## 2023-01-11 NOTE — Assessment & Plan Note (Signed)
Updated lab needed at/ before next visit.   

## 2023-03-06 IMAGING — CT CT CHEST LUNG CANCER SCREENING LOW DOSE W/O CM
1 series · 10 of 10 positions shown, 13 images · non-contrast
Comparison: None.

CLINICAL DATA: Current smoker, 48 pack-year history.



[ct lung segmentation data · axial · 0.67mm/px · z∈[+1301,+1301]mm · 10 of 278 frames shown]
[frame 1/278  mediastinal]
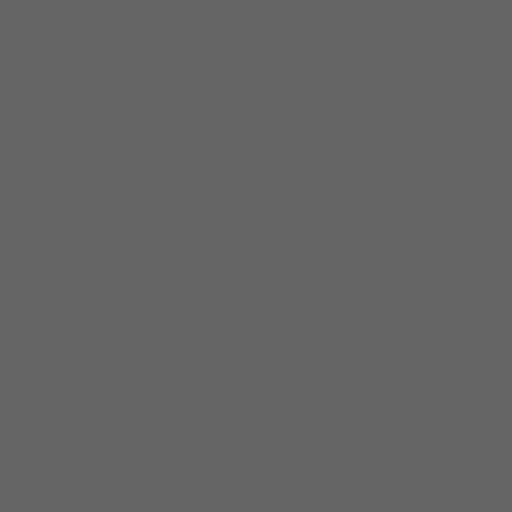
[frame 1/278  lung]
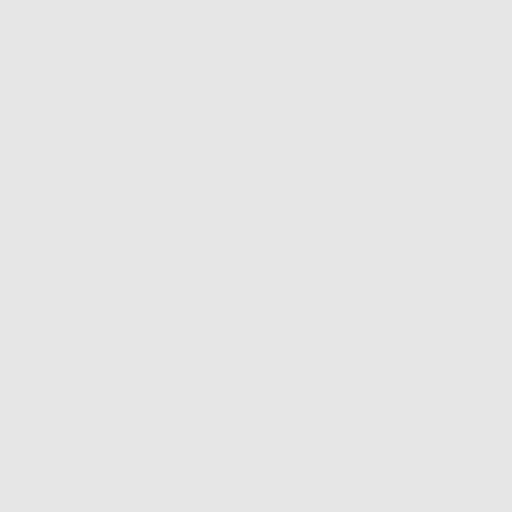
[frame 31/278  lung]
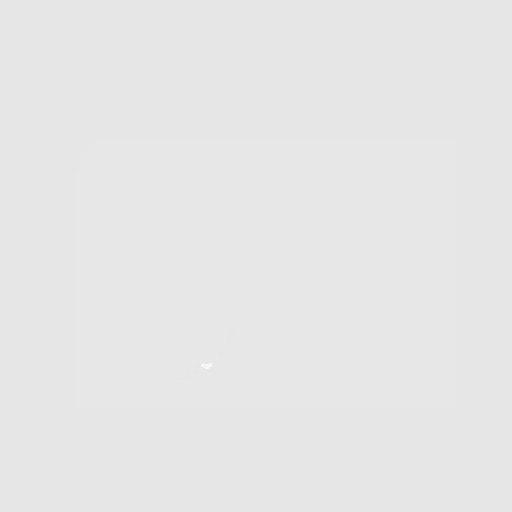
[frame 62/278  lung]
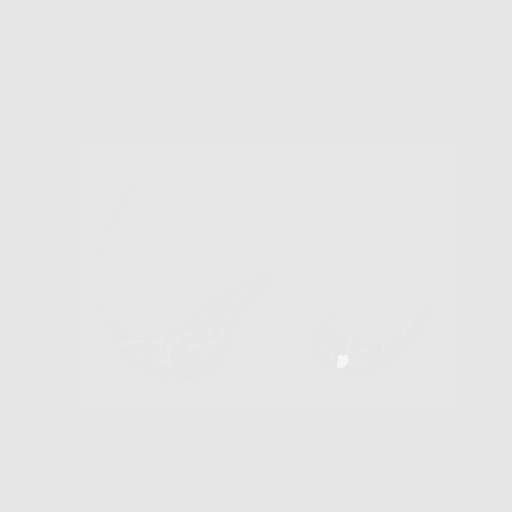
[frame 93/278  lung]
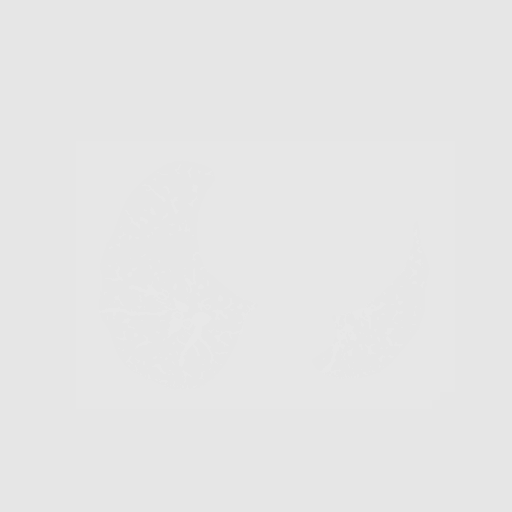
[frame 124/278  mediastinal]
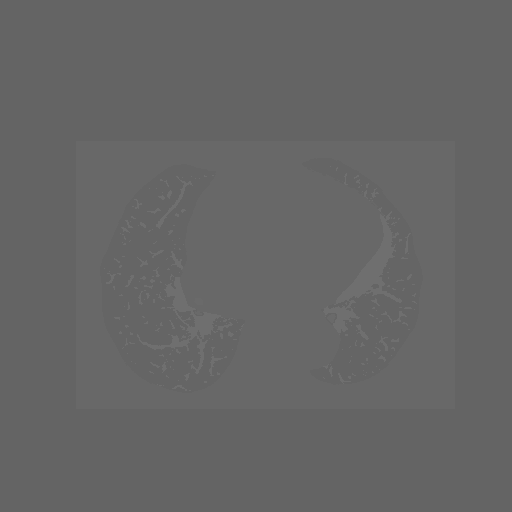
[frame 124/278  lung]
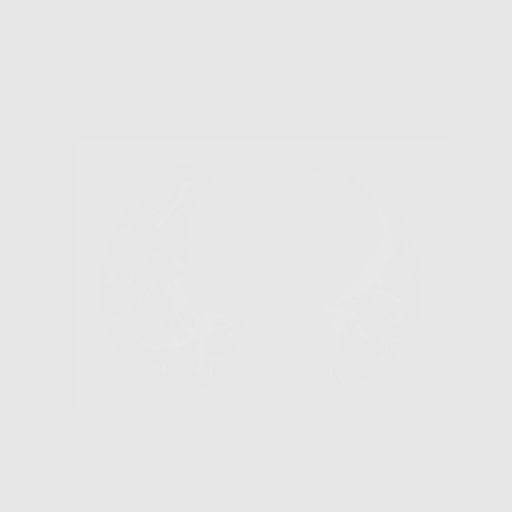
[frame 154/278  lung]
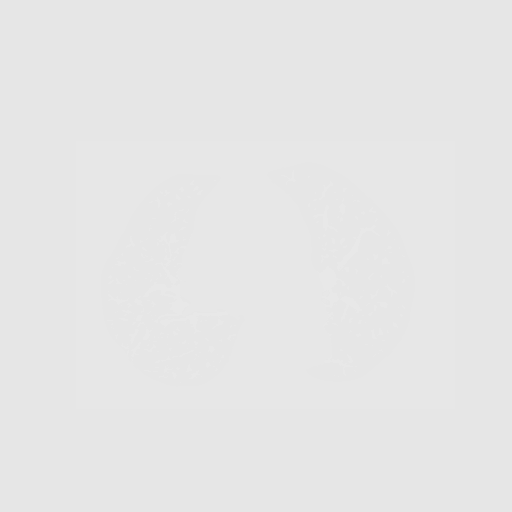
[frame 185/278  lung]
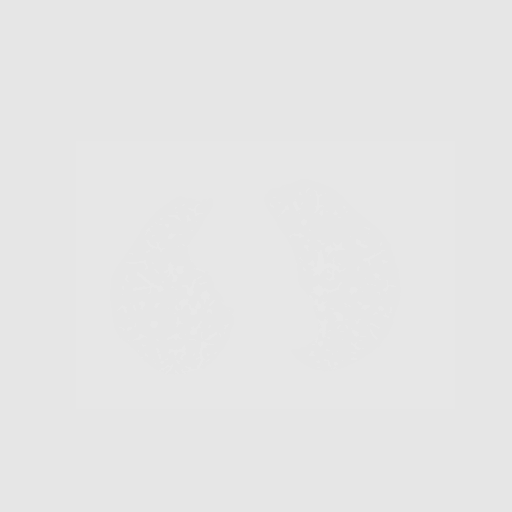
[frame 216/278  lung]
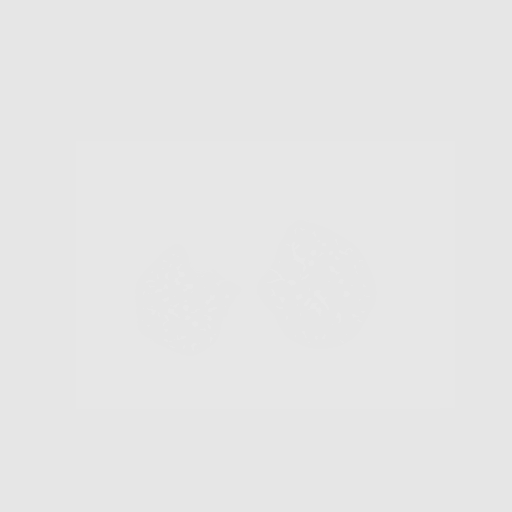
[frame 247/278  mediastinal]
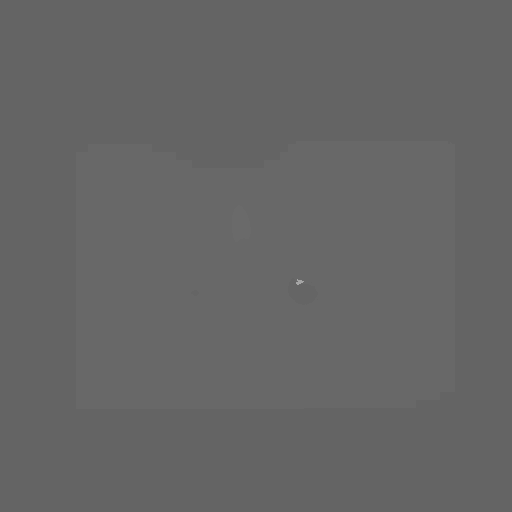
[frame 247/278  lung]
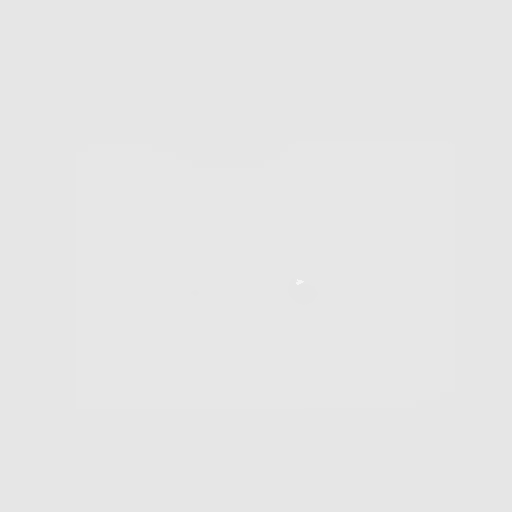
[frame 278/278  lung]
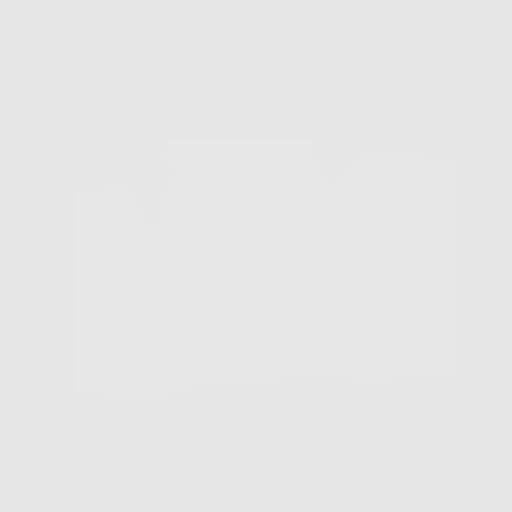

[10 of 10 positions shown; findings below may reference images not displayed]

FINDINGS: Cardiovascular: Atherosclerotic calcification of the aorta, aortic
valve and coronary arteries. Heart is enlarged. No pericardial
effusion.

Mediastinum/Nodes: No pathologically enlarged mediastinal or
axillary lymph nodes. Hilar regions are difficult to definitively
evaluate without IV contrast. Esophagus is grossly unremarkable.

Lungs/Pleura: Mild centrilobular emphysema. Scattered pulmonary
parenchymal scarring. No suspicious pulmonary nodules. No pleural
fluid. Airway is unremarkable.

Upper Abdomen: Numerous low-attenuation lesions in the liver measure
up to 4.4 cm and are indicative of cysts. Visualized portions of the
liver, adrenal glands, kidneys, spleen, pancreas, stomach and bowel
are otherwise unremarkable.

Musculoskeletal: Mild degenerative changes in the spine. No
worrisome lytic or sclerotic lesions.
IMPRESSION: 1. Lung-RADS 1, negative. Continue annual screening with low-dose
chest CT without contrast in 12 months.
2. Aortic atherosclerosis (E6WTV-CKH.H). Coronary artery
calcification.
3.  Emphysema (E6WTV-FNX.L).

## 2023-03-17 ENCOUNTER — Other Ambulatory Visit: Payer: Self-pay | Admitting: Family Medicine

## 2023-04-03 IMAGING — US US RENAL
1 series · 14 of 25 positions shown · non-contrast
Comparison: None Available.

CLINICAL DATA: Chronic renal disease.  Hypertension.

EXAM:
RENAL / URINARY TRACT ULTRASOUND COMPLETE

[Series 1: us renal · 14 of 48 slices shown]
[im 1/48]
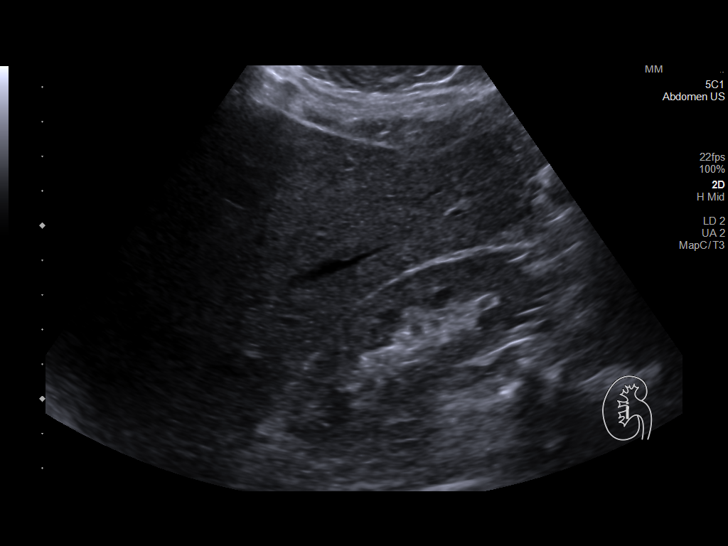
[im 4/48]
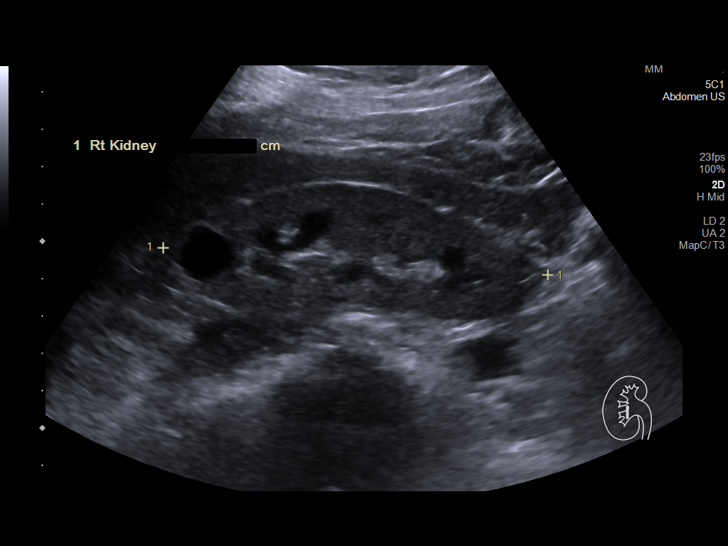
[im 8/48]
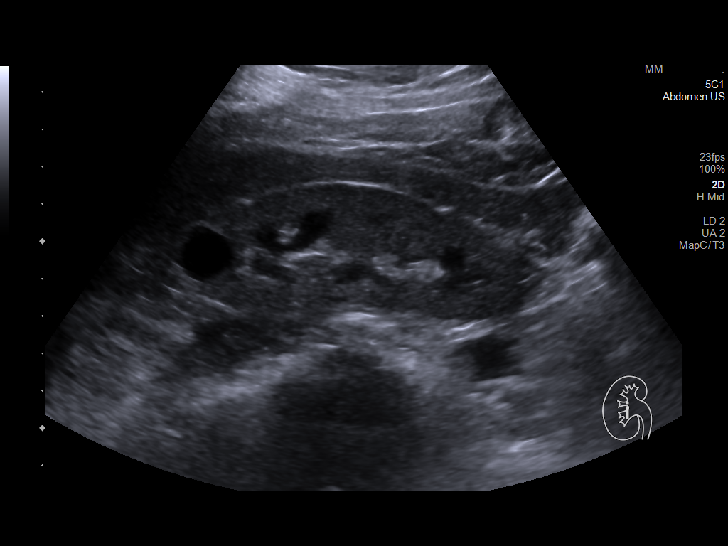
[im 12/48]
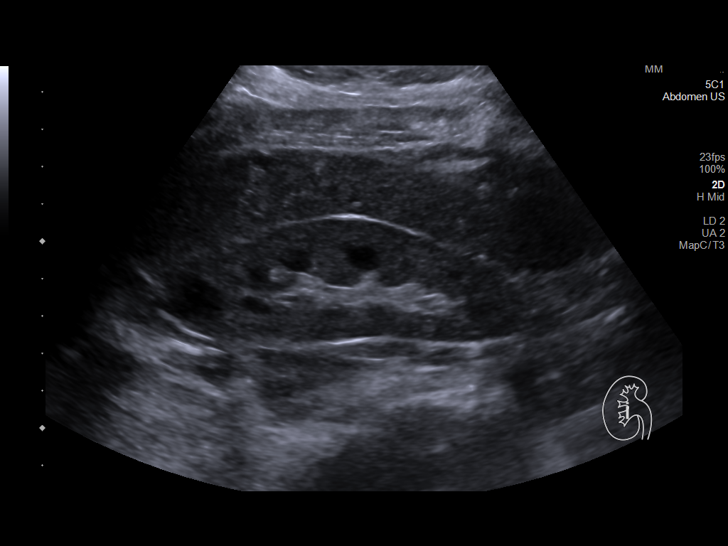
[im 16/48]
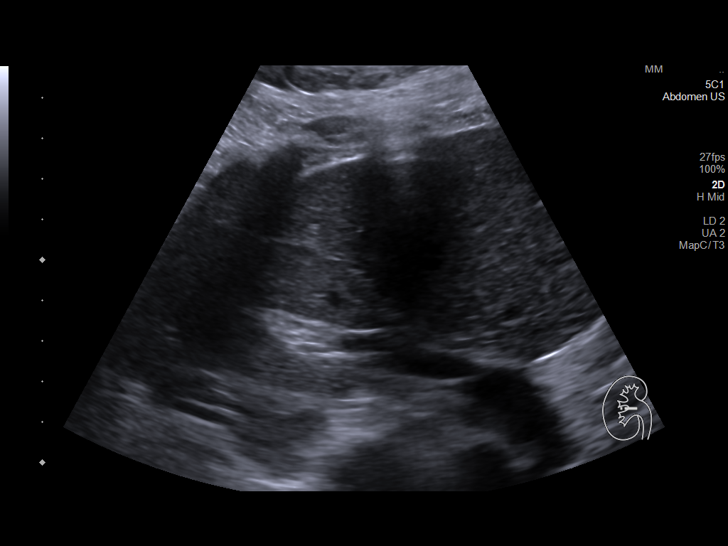
[im 18/48]
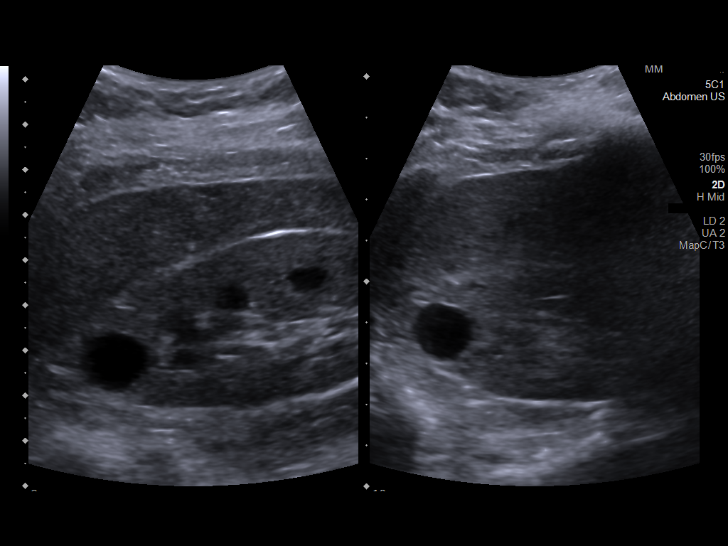
[im 22/48]
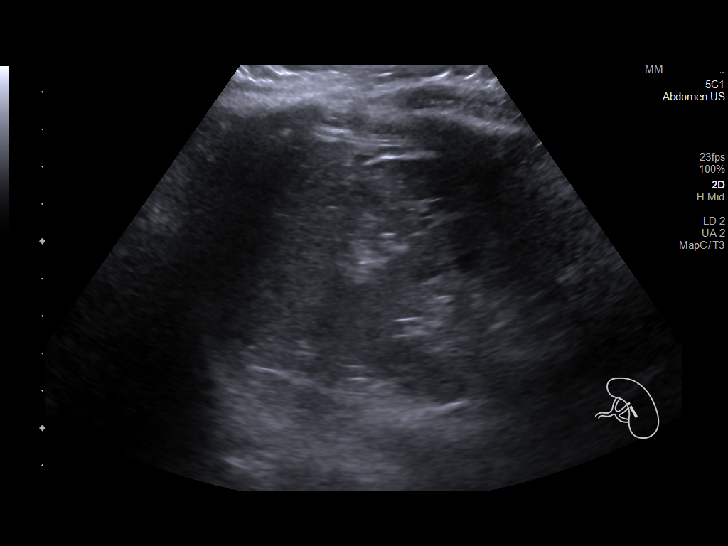
[im 26/48]
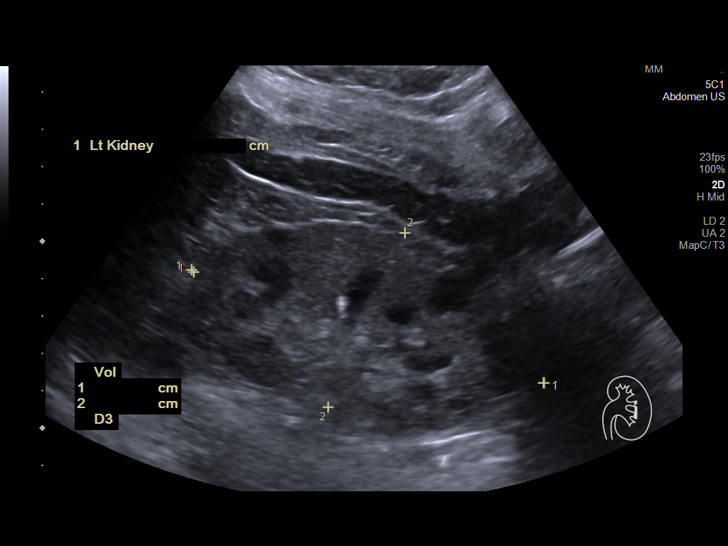
[im 30/48]
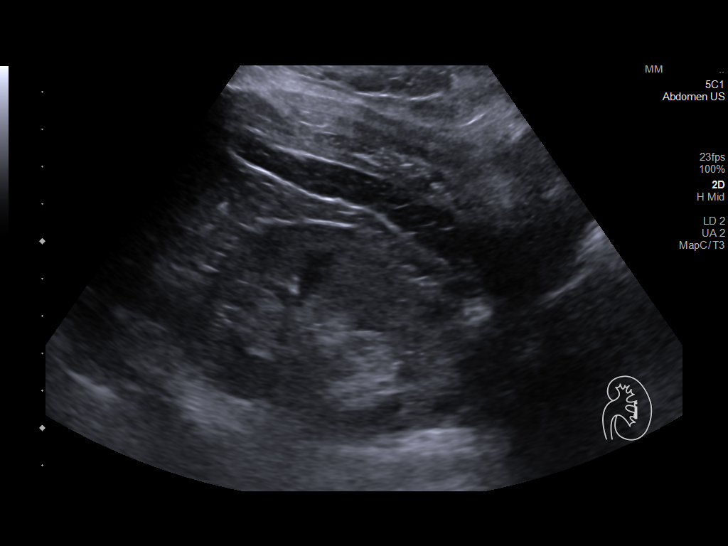
[im 32/48]
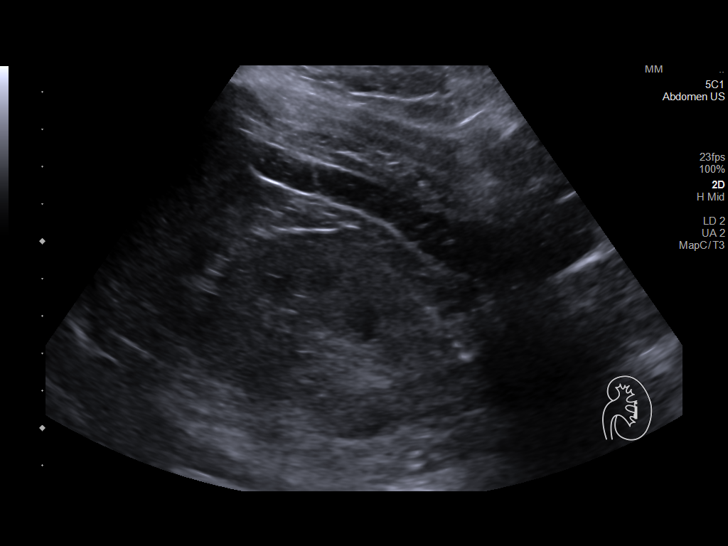
[im 36/48]
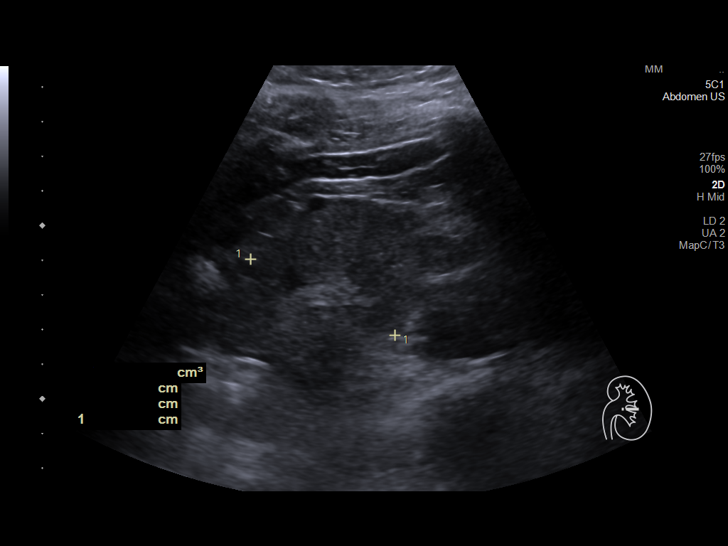
[im 40/48]
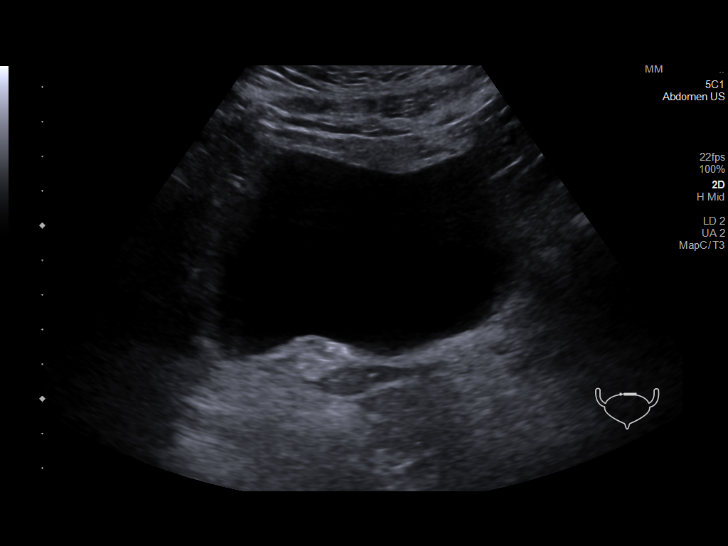
[im 44/48]
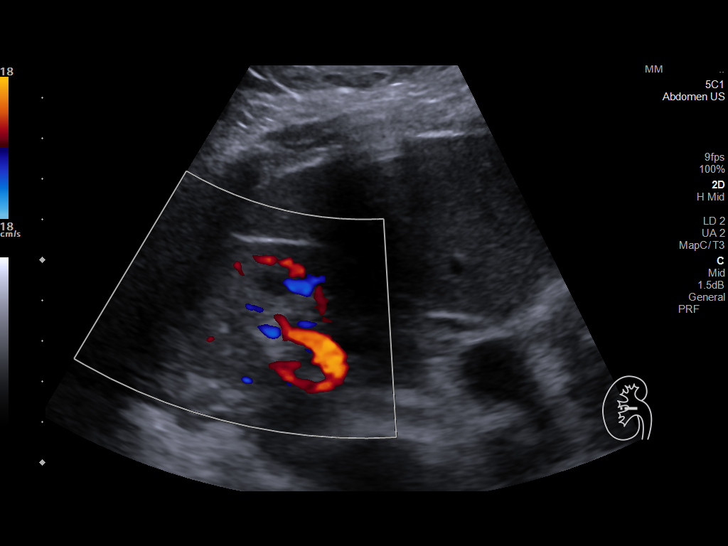
[im 48/48]
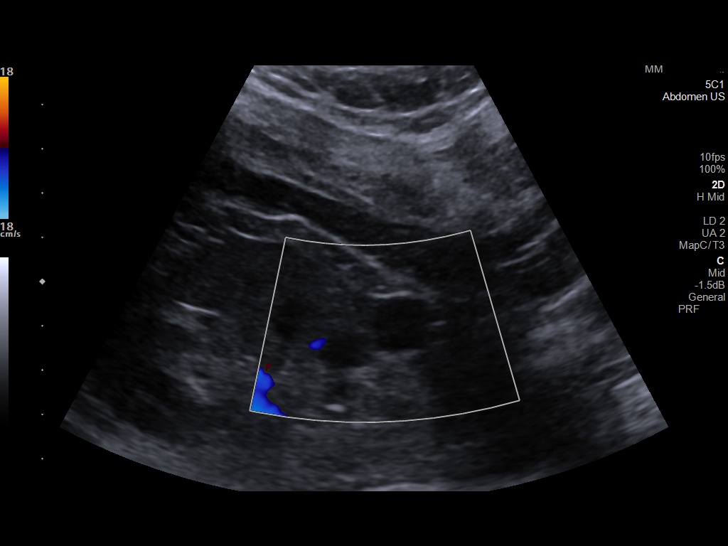

[14 of 25 positions shown; findings below may reference images not displayed]

FINDINGS: Right Kidney:

Renal measurements: 10.3 x 3.2 x 4.7 cm = volume: 83 mL. There is a
1.7 cm simple cyst in the upper pole of no clinical significance. No
follow-up imaging is recommended.

Left Kidney:

Renal measurements: 9.8 x 5.1 x 4.7 cm = volume: 125 mL. There is a
1.4 cm simple cyst in the lower pole the left kidney of no clinical
significance. No follow-up imaging recommended.

Bladder:

Appears normal for degree of bladder distention.

Other:

None.
IMPRESSION: 1. No significant abnormalities. The bladder and kidneys are
unremarkable.

## 2023-04-03 IMAGING — US US ABDOMEN LIMITED
1 series · 14 of 25 positions shown · non-contrast
Comparison: None Available.

CLINICAL DATA: Multiple liver cysts identified on chest CT.

EXAM:
ULTRASOUND ABDOMEN LIMITED RIGHT UPPER QUADRANT

[Series 1: us abdomen limited ruq (liver/gb) · 14 of 47 slices shown]
[im 1/47]
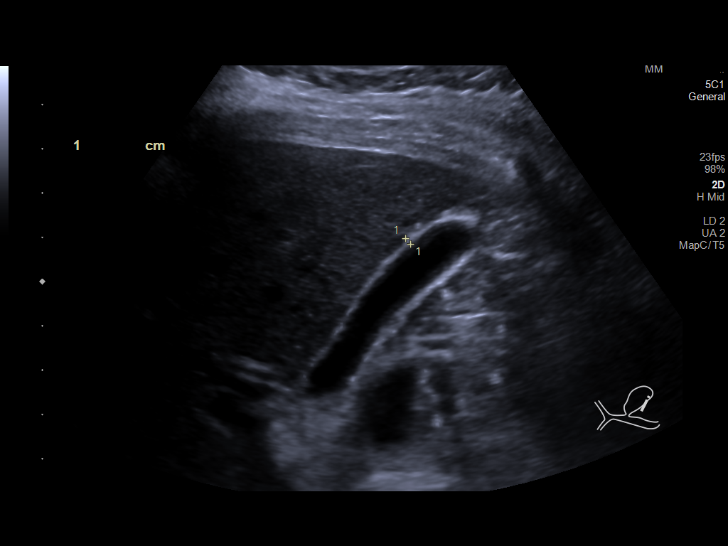
[im 4/47]
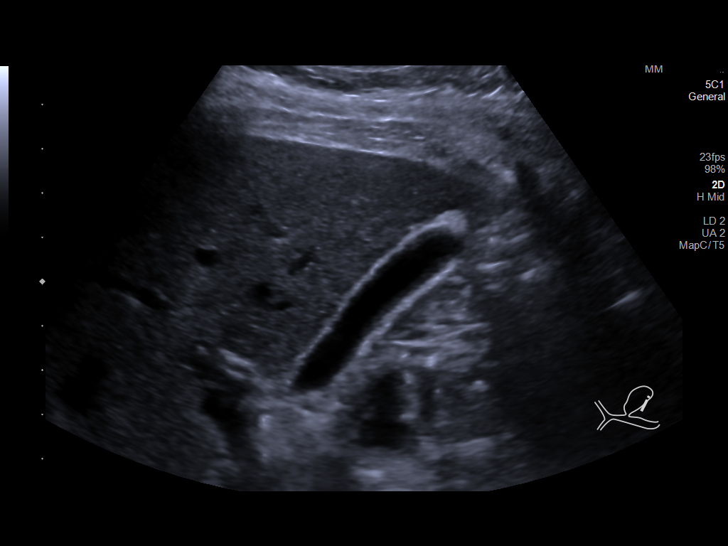
[im 8/47]
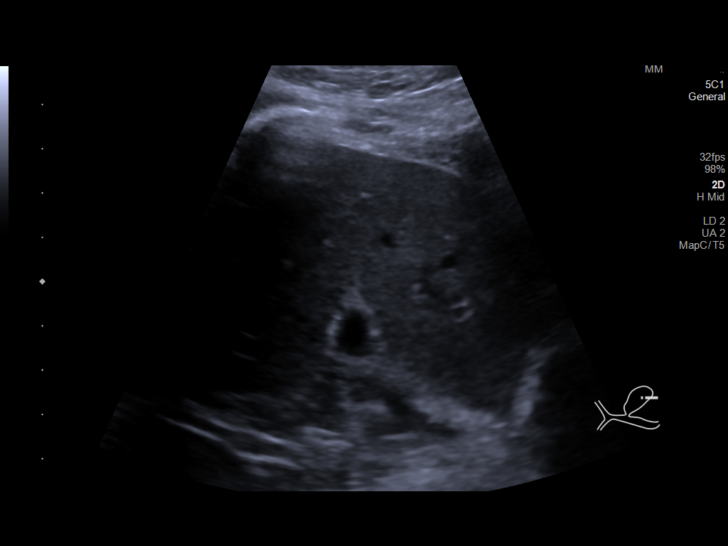
[im 12/47]
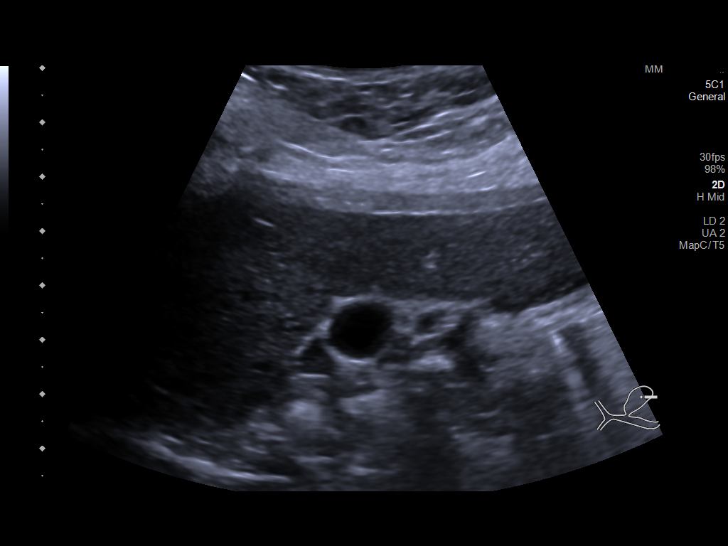
[im 16/47]
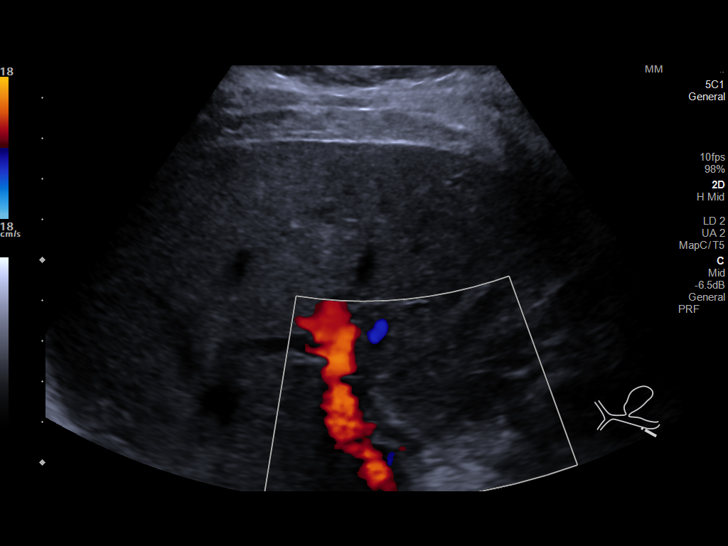
[im 18/47]
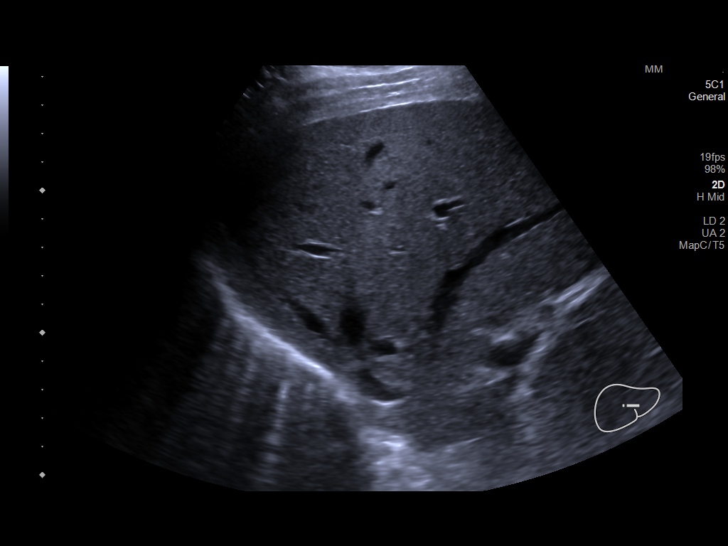
[im 22/47]
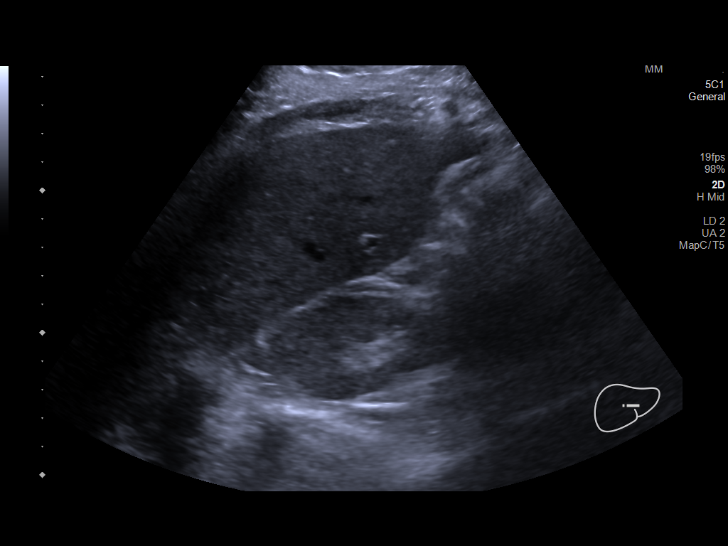
[im 25/47]
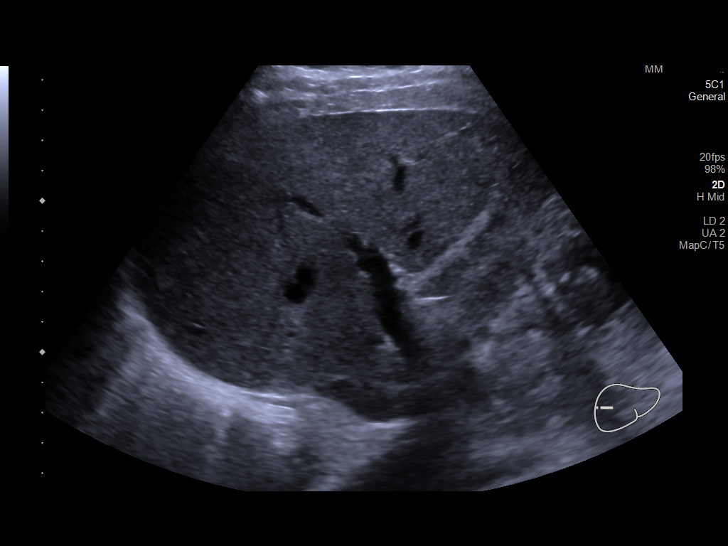
[im 29/47]
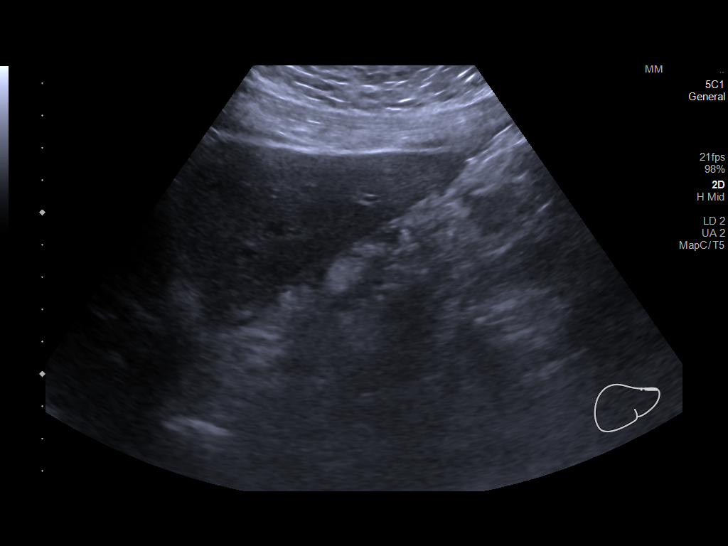
[im 31/47]
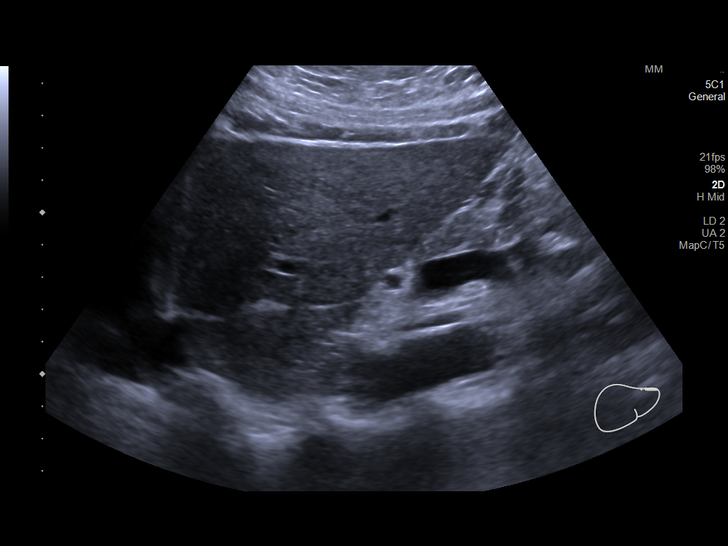
[im 35/47]
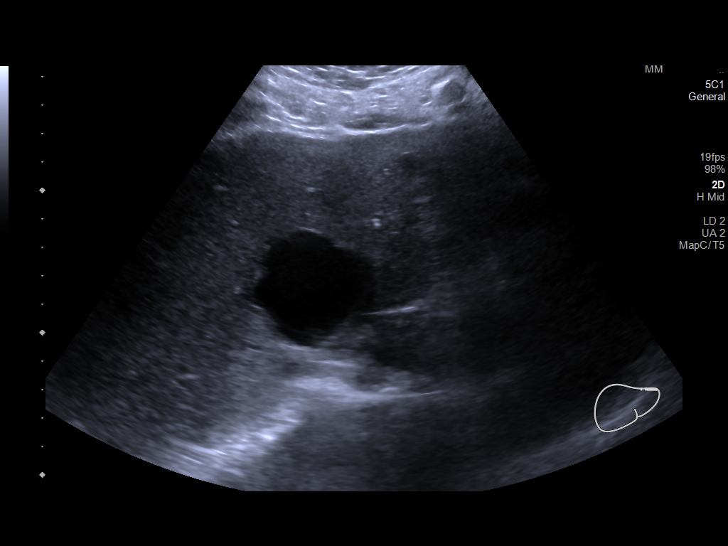
[im 39/47]
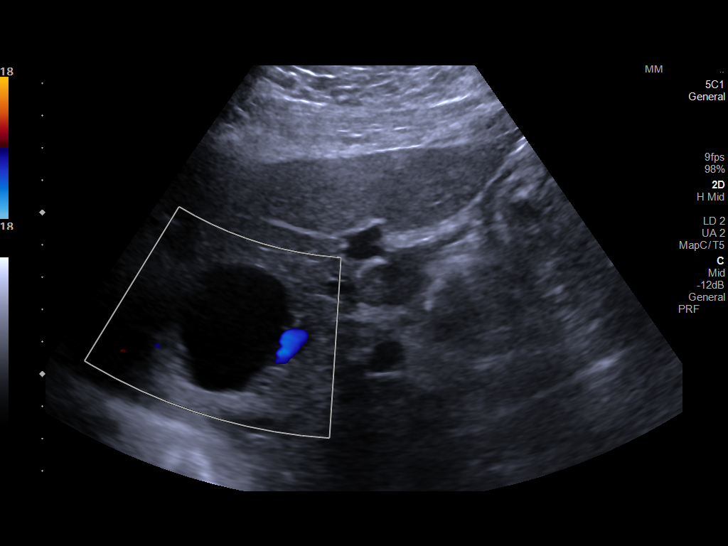
[im 43/47]
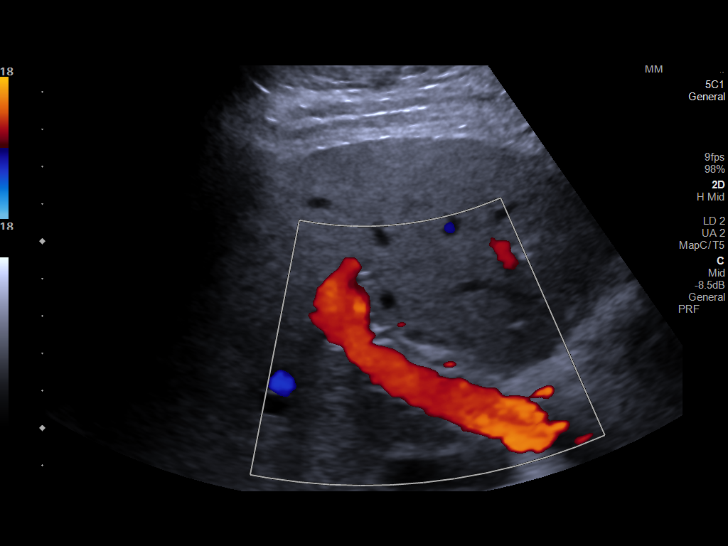
[im 47/47]
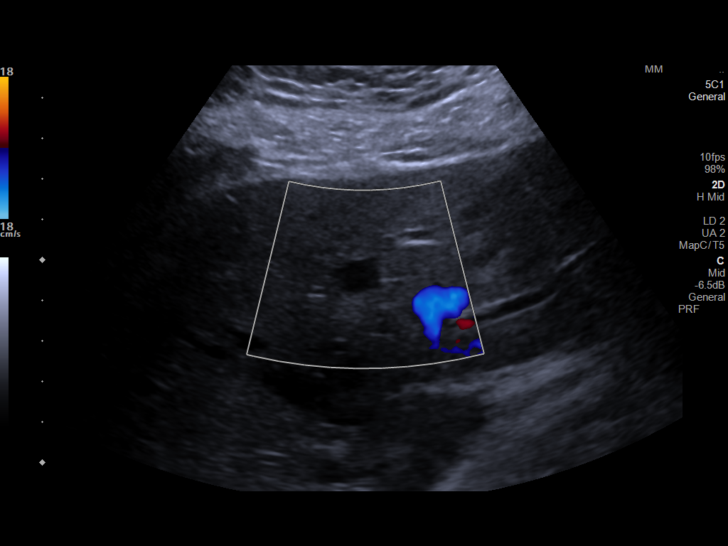

[14 of 25 positions shown; findings below may reference images not displayed]

FINDINGS: Gallbladder:

No gallstones or wall thickening visualized. No sonographic Murphy
sign noted by sonographer.

Common bile duct:

Diameter: 3.8 mm

Liver:

Two simple cysts are identified in the liver. A left lobe cyst
adjacent to the IVC measures 3.6 x 4.1 x 4.4 cm. A smaller cyst in
the left hepatic lobe measures up to 11 mm. Neither cyst is of
clinical significance. No follow-up imaging recommended. Portal vein
is patent on color Doppler imaging with normal direction of blood
flow towards the liver.

Other: None.
IMPRESSION: No significant abnormalities.

## 2023-04-14 ENCOUNTER — Encounter (INDEPENDENT_AMBULATORY_CARE_PROVIDER_SITE_OTHER): Payer: Self-pay | Admitting: *Deleted

## 2023-04-25 ENCOUNTER — Ambulatory Visit (HOSPITAL_COMMUNITY)
Admission: RE | Admit: 2023-04-25 | Discharge: 2023-04-25 | Disposition: A | Discharge status: Home / Self Care | Payer: Medicare HMO | Source: Ambulatory Visit | Attending: Family Medicine | Admitting: Family Medicine

## 2023-04-25 ENCOUNTER — Encounter (HOSPITAL_COMMUNITY): Payer: Self-pay

## 2023-04-25 DIAGNOSIS — Z1231 Encounter for screening mammogram for malignant neoplasm of breast: Secondary | ICD-10-CM | POA: Diagnosis present

## 2023-05-17 ENCOUNTER — Encounter: Payer: Self-pay | Admitting: Family Medicine

## 2023-05-17 ENCOUNTER — Ambulatory Visit (INDEPENDENT_AMBULATORY_CARE_PROVIDER_SITE_OTHER): Payer: Medicare HMO | Admitting: Family Medicine

## 2023-05-17 VITALS — BP 162/93 | HR 68 | Ht 65.0 in | Wt 169.0 lb

## 2023-05-17 DIAGNOSIS — F17200 Nicotine dependence, unspecified, uncomplicated: Secondary | ICD-10-CM

## 2023-05-17 DIAGNOSIS — G4719 Other hypersomnia: Secondary | ICD-10-CM

## 2023-05-17 DIAGNOSIS — F17210 Nicotine dependence, cigarettes, uncomplicated: Secondary | ICD-10-CM | POA: Diagnosis not present

## 2023-05-17 DIAGNOSIS — E790 Hyperuricemia without signs of inflammatory arthritis and tophaceous disease: Secondary | ICD-10-CM

## 2023-05-17 DIAGNOSIS — N184 Chronic kidney disease, stage 4 (severe): Secondary | ICD-10-CM

## 2023-05-17 DIAGNOSIS — I129 Hypertensive chronic kidney disease with stage 1 through stage 4 chronic kidney disease, or unspecified chronic kidney disease: Secondary | ICD-10-CM | POA: Diagnosis not present

## 2023-05-17 DIAGNOSIS — R5383 Other fatigue: Secondary | ICD-10-CM | POA: Diagnosis not present

## 2023-05-17 DIAGNOSIS — E785 Hyperlipidemia, unspecified: Secondary | ICD-10-CM

## 2023-05-17 DIAGNOSIS — M10171 Lead-induced gout, right ankle and foot: Secondary | ICD-10-CM

## 2023-05-17 DIAGNOSIS — E66811 Obesity, class 1: Secondary | ICD-10-CM

## 2023-05-17 DIAGNOSIS — Z1211 Encounter for screening for malignant neoplasm of colon: Secondary | ICD-10-CM

## 2023-05-17 DIAGNOSIS — D126 Benign neoplasm of colon, unspecified: Secondary | ICD-10-CM

## 2023-05-17 MED ORDER — PREDNISONE 5 MG PO TABS: 5.0000 mg | ORAL_TABLET | Freq: Two times a day (BID) | ORAL | 0 refills | Status: AC

## 2023-05-17 NOTE — Patient Instructions (Addendum)
 F/U in 4 months, call if yo need me sooner  Labs today  Need to stop smoking and cut back on lemonade , the artificial sweetner in excess is not recommended  You are referred for sleep evaluation important you follow through You need to get colonoscopy, new referral is entered  5 days of low dose prednisone is prescribed  8 weeks of wegovy samples are provided to furhter help with weight loss It is important that you exercise regularly at least 30 minutes 5 times a week. If you develop chest pain, have severe difficulty breathing, or feel very tired, stop exercising immediately and seek medical attention    Thanks for choosing [facility], we consider it a privelige to serve you.

## 2023-05-17 NOTE — Progress Notes (Signed)
 Christina Williamson     MRN: [id number]      DOB: 1955/07/07  Chief Complaint  Patient presents with   Hypertension    18 week follow up  Rt ankle pain. Was told by [facility]  that it was gout she is asking for a refill of the medication they gave her.  Would like to discuss Phentermine  Review Colonoscopy options     HPI Christina Williamson is here for follow up and re-evaluation of chronic medical conditions, medication management and review of any available recent lab and radiology data.  Preventive health is updated, specifically  Cancer screening and Immunization.   Questions or concerns regarding consultations or procedures which the PT has had in the interim are  addressed. Concerns as above. She  was prescribed indomethacin 50 mg three tiomes daily #15 on 2/19 by urgent care for gout, has podiatry appt next week, good response to indomethacin Has been using wegovy bought on line which has helped a lot with weight loss states too expensive however , wonders if she can get back on phentermine , blood pressure uncontrolled however so not an option C/o chronic fatigue, was dx with sleep apnea, did not get equipment however, understands the importance of following through on this ROS Denies recent fever or chills. Denies sinus pressure, nasal congestion, ear pain or sore throat. Denies chest congestion, productive cough or wheezing. Denies chest pains, palpitations and leg swelling Denies abdominal pain, nausea, vomiting,diarrhea or constipation.   Denies dysuria, frequency, hesitancy or incontinence.  Denies headaches, seizures, numbness, or tingling. Denies depression, anxiety or insomnia. Denies skin break down or rash.   PE  BP (!) 162/93   Pulse 68   Ht 5\' 5"  (1.651 m)   Wt 169 lb 0.6 oz (76.7 kg)   SpO2 98%   BMI 28.13 kg/m   Patient alert and oriented  HEENT: No facial asymmetry, EOMI,     Neck supple .  Chest: Clear to auscultation bilaterally.  CVS: S1, S2 no murmurs, no  S3.Regular rate.  ABD: Soft non tender.   Ext: No edema  MS: Adequate ROM spine, shoulders, hips and knees.Decreased in right ankle which is ,mildly swollen and tender  Skin: Intact, no ulcerations or rash noted.  Psych: Good eye contact, normal affect. Memory intact not anxious or depressed appearing.  CNS: CN 2-12 intact, power,  normal throughout.no focal deficits noted.   Assessment & Plan  NICOTINE ADDICTION Asked:confirms currently smokes cigarettes approx 3/day Assess: Unwilling to set a quit date, but is cutting back Advise: needs to QUIT to reduce risk of cancer, cardio and cerebrovascular disease Assist: counseled for 5 minutes and literature provided Arrange: follow up in 2 to 4 months Refer for lung cancer screening   Obesity (BMI 30.0-34.9)  Patient re-educated about  the importance of commitment to a  minimum of 150 minutes of exercise per week as able.  The importance of healthy food choices with portion control discussed, as well as eating regularly and within a 12 hour window most days. The need to choose "clean , green" food 50 to 75% of the time is discussed, as well as to make water the primary drink and set a goal of 64 ounces water daily.       05/17/2023    9:15 AM 01/11/2023    2:44 PM 10/05/2022    1:35 PM  Weight /BMI  Weight 169 lb 0.6 oz 175 lb 1.3 oz 182 lb 0.6 oz  Height 5\' 5"  (1.651 m) 5\' 3"  (1.6 m) 5\' 3"  (1.6 m)  BMI 28.13 kg/m2 31.01 kg/m2 32.25 kg/m2    Improved, 2 months wegovy samples provided  Hyperuricemia Check uric acid level will prescribe short course of prednisone for pain, due to renal failure indocin not preerred  Malignant hypertension (arteriolar nephrosclerosis), stage 1-4 or unspecified chronic kidney disease DASH diet and commitment to daily physical activity for a minimum of 30 minutes discussed and encouraged, as a part of hypertension management. The importance of attaining a healthy weight is also discussed.      05/17/2023    9:21 AM 05/17/2023    9:15 AM 01/11/2023    3:23 PM 01/11/2023    2:46 PM 01/11/2023    2:44 PM 10/05/2022    2:09 PM 10/05/2022    1:36 PM  BP/Weight  Systolic BP 162 169 156 156 147 148 141  Diastolic BP 93 84 94 87 100 82 88  Wt. (Lbs)  169.04   175.08    BMI  28.13 kg/m2   31.01 kg/m2        Uncontrolled and manged by Nephrology reports npot taking med hte morning of the e visit, however regualrly documented as high  Excessive daytime sleepiness Needs sleep eval  Fatigue Needs sleep eval  Tubular adenoma of colon Colonoscopy past due , referral started over   Dyslipidemia Hyperlipidemia:Low fat diet discussed and encouraged.   Lipid Panel  Lab Results  Component Value Date   CHOL 209 (H) 05/17/2023   HDL 75 05/17/2023   LDLCALC 110 (H) 05/17/2023   TRIG 139 05/17/2023   CHOLHDL 2.8 05/17/2023     Needs to reduce fat in diet  CKD (chronic kidney disease) stage 4, GFR 15-29 ml/min (HCC) Managed by Nephrology

## 2023-05-18 ENCOUNTER — Encounter (INDEPENDENT_AMBULATORY_CARE_PROVIDER_SITE_OTHER): Payer: Self-pay | Admitting: *Deleted

## 2023-05-18 ENCOUNTER — Encounter: Payer: Self-pay | Admitting: Family Medicine

## 2023-05-18 LAB — CBC
Hematocrit: 36.5 % (ref 34.0–46.6)
Hemoglobin: 11.9 g/dL (ref 11.1–15.9)
MCH: 31.6 pg (ref 26.6–33.0)
MCHC: 32.6 g/dL (ref 31.5–35.7)
MCV: 97 fL (ref 79–97)
Platelets: 331 10*3/uL (ref 150–450)
RBC: 3.76 x10E6/uL — ABNORMAL LOW (ref 3.77–5.28)
RDW: 11.9 % (ref 11.7–15.4)
WBC: 7.4 10*3/uL (ref 3.4–10.8)

## 2023-05-18 LAB — LIPID PANEL
Chol/HDL Ratio: 2.8 ratio (ref 0.0–4.4)
Cholesterol, Total: 209 mg/dL — ABNORMAL HIGH (ref 100–199)
HDL: 75 mg/dL (ref >39)
LDL Chol Calc (NIH): 110 mg/dL — ABNORMAL HIGH (ref 0–99)
Triglycerides: 139 mg/dL (ref 0–149)
VLDL Cholesterol Cal: 24 mg/dL (ref 5–40)

## 2023-05-18 LAB — CMP14+EGFR
ALT: 15 IU/L (ref 0–32)
AST: 20 IU/L (ref 0–40)
Albumin: 4.4 g/dL (ref 3.9–4.9)
Alkaline Phosphatase: 130 IU/L — ABNORMAL HIGH (ref 44–121)
BUN/Creatinine Ratio: 21 (ref 12–28)
BUN: 48 mg/dL — ABNORMAL HIGH (ref 8–27)
Bilirubin Total: 0.3 mg/dL (ref 0.0–1.2)
CO2: 22 mmol/L (ref 20–29)
Calcium: 9.9 mg/dL (ref 8.7–10.3)
Chloride: 104 mmol/L (ref 96–106)
Creatinine, Ser: 2.3 mg/dL — ABNORMAL HIGH (ref 0.57–1.00)
Globulin, Total: 2.7 g/dL (ref 1.5–4.5)
Glucose: 85 mg/dL (ref 70–99)
Potassium: 3.8 mmol/L (ref 3.5–5.2)
Sodium: 144 mmol/L (ref 134–144)
Total Protein: 7.1 g/dL (ref 6.0–8.5)
eGFR: 23 mL/min/{1.73_m2} — ABNORMAL LOW (ref >59)

## 2023-05-18 LAB — TSH: TSH: 2.3 u[IU]/mL (ref 0.450–4.500)

## 2023-05-18 LAB — VITAMIN D 25 HYDROXY (VIT D DEFICIENCY, FRACTURES): Vit D, 25-Hydroxy: 24.9 ng/mL — ABNORMAL LOW (ref 30.0–100.0)

## 2023-05-18 LAB — URIC ACID: Uric Acid: 8.1 mg/dL — ABNORMAL HIGH (ref 3.0–7.2)

## 2023-05-18 LAB — VITAMIN B12: Vitamin B-12: 1800 pg/mL — ABNORMAL HIGH (ref 232–1245)

## 2023-05-18 MED ORDER — ALLOPURINOL 100 MG PO TABS: 100.0000 mg | ORAL_TABLET | Freq: Every day | ORAL | 6 refills | Status: DC

## 2023-05-23 ENCOUNTER — Encounter: Payer: Self-pay | Admitting: Family Medicine

## 2023-05-23 ENCOUNTER — Ambulatory Visit: Payer: Self-pay

## 2023-05-23 DIAGNOSIS — E790 Hyperuricemia without signs of inflammatory arthritis and tophaceous disease: Secondary | ICD-10-CM | POA: Insufficient documentation

## 2023-05-23 NOTE — Assessment & Plan Note (Addendum)
 Asked:confirms currently smokes cigarettes approx 3/day Assess: Unwilling to set a quit date, but is cutting back Advise: needs to QUIT to reduce risk of cancer, cardio and cerebrovascular disease Assist: counseled for 5 minutes and literature provided Arrange: follow up in 2 to 4 months Refer for lung cancer screening

## 2023-05-23 NOTE — Assessment & Plan Note (Signed)
 Check uric acid level will prescribe short course of prednisone for pain, due to renal failure indocin not preerred

## 2023-05-23 NOTE — Assessment & Plan Note (Signed)
 Hyperlipidemia:Low fat diet discussed and encouraged.   Lipid Panel  Lab Results  Component Value Date   CHOL 209 (H) 05/17/2023   HDL 75 05/17/2023   LDLCALC 110 (H) 05/17/2023   TRIG 139 05/17/2023   CHOLHDL 2.8 05/17/2023     Needs to reduce fat in diet

## 2023-05-23 NOTE — Assessment & Plan Note (Signed)
 Needs sleep eval

## 2023-05-23 NOTE — Assessment & Plan Note (Signed)
  Patient re-educated about  the importance of commitment to a  minimum of 150 minutes of exercise per week as able.  The importance of healthy food choices with portion control discussed, as well as eating regularly and within a 12 hour window most days. The need to choose "clean , green" food 50 to 75% of the time is discussed, as well as to make water the primary drink and set a goal of 64 ounces water daily.       05/17/2023    9:15 AM 01/11/2023    2:44 PM 10/05/2022    1:35 PM  Weight /BMI  Weight 169 lb 0.6 oz 175 lb 1.3 oz 182 lb 0.6 oz  Height 5\' 5"  (1.651 m) 5\' 3"  (1.6 m) 5\' 3"  (1.6 m)  BMI 28.13 kg/m2 31.01 kg/m2 32.25 kg/m2    Improved, 2 months wegovy samples provided

## 2023-05-23 NOTE — Assessment & Plan Note (Signed)
 DASH diet and commitment to daily physical activity for a minimum of 30 minutes discussed and encouraged, as a part of hypertension management. The importance of attaining a healthy weight is also discussed.     05/17/2023    9:21 AM 05/17/2023    9:15 AM 01/11/2023    3:23 PM 01/11/2023    2:46 PM 01/11/2023    2:44 PM 10/05/2022    2:09 PM 10/05/2022    1:36 PM  BP/Weight  Systolic BP 162 169 156 156 147 148 141  Diastolic BP 93 84 94 87 100 82 88  Wt. (Lbs)  169.04   175.08    BMI  28.13 kg/m2   31.01 kg/m2        Uncontrolled and manged by Nephrology reports npot taking med hte morning of the e visit, however regualrly documented as high

## 2023-05-23 NOTE — Telephone Encounter (Signed)
  Chief Complaint: LLQ pain Symptoms: constant, cramping, sever pain Frequency: about a week  Pertinent Negatives: Patient denies fever, radiation to back/other parts of abd, N/V,  Disposition: [x] ED /[] Urgent Care (no appt availability in office) / [] Appointment(In office/virtual)/ []  [facility]/ [] Home Care/ [] Refused Recommended Disposition /[] [facility]/ []  Follow-up with PCP Additional Notes: Pt states 10/10 LLQ pain that is constant and ongoing for about a week. Pt states that BM was hard yesterday, took laxative, pt states that she is having diarrhea now. Pt states pain worse with cough. Pt states that she has never had ABD surg. Pt advised ED. Pt agreeable.  Copied from CRM [id number]. Topic: Clinical - Red Word Triage >> May 23, 2023 11:21 AM Patsy Lager T wrote: Red Word that prompted transfer to Nurse Triage: patient has been experiencing lower abdominal pain on the left side since last week and its severe Reason for Disposition  [1] SEVERE pain (e.g., excruciating) AND [2] present > 1 hour  Answer Assessment - Initial Assessment Questions 1. LOCATION: "Where does it hurt?"      LLQ 2. RADIATION: "Does the pain shoot anywhere else?" (e.g., chest, back)     denies 3. ONSET: "When did the pain begin?" (e.g., minutes, hours or days ago)      About a week 4. SUDDEN: "Gradual or sudden onset?"     sudden 5. PATTERN "Does the pain come and go, or is it constant?"    - If it comes and goes: "How long does it last?" "Do you have pain now?"     (Note: Comes and goes means the pain is intermittent. It goes away completely between bouts.)    - If constant: "Is it getting better, staying the same, or getting worse?"      (Note: Constant means the pain never goes away completely; most serious pain is constant and gets worse.)      constant 6. SEVERITY: "How bad is the pain?"  (e.g., Scale 1-10; mild, moderate, or severe)    - MILD (1-3): Doesn't interfere with normal  activities, abdomen soft and not tender to touch.     - MODERATE (4-7): Interferes with normal activities or awakens from sleep, abdomen tender to touch.     - SEVERE (8-10): Excruciating pain, doubled over, unable to do any normal activities.       10 7. RECURRENT SYMPTOM: "Have you ever had this type of stomach pain before?" If Yes, ask: "When was the last time?" and "What happened that time?"      denies 8. CAUSE: "What do you think is causing the stomach pain?"     constipation 9. RELIEVING/AGGRAVATING FACTORS: "What makes it better or worse?" (e.g., antacids, bending or twisting motion, bowel movement)     Diarrhea now, took laxative 10. OTHER SYMPTOMS: "Do you have any other symptoms?" (e.g., back pain, diarrhea, fever, urination pain, vomiting)       Diarrhea now, because took laxative yesterday  Protocols used: Abdominal Pain - [facility]

## 2023-05-23 NOTE — Assessment & Plan Note (Signed)
 Managed by Nephrology

## 2023-05-23 NOTE — Assessment & Plan Note (Signed)
 Colonoscopy past due , referral started over

## 2023-05-25 ENCOUNTER — Telehealth: Payer: Self-pay | Admitting: Family Medicine

## 2023-05-25 ENCOUNTER — Ambulatory Visit (INDEPENDENT_AMBULATORY_CARE_PROVIDER_SITE_OTHER): Payer: Medicare HMO

## 2023-05-25 VITALS — Ht 63.0 in | Wt 169.0 lb

## 2023-05-25 DIAGNOSIS — N83202 Unspecified ovarian cyst, left side: Secondary | ICD-10-CM

## 2023-05-25 DIAGNOSIS — K5792 Diverticulitis of intestine, part unspecified, without perforation or abscess without bleeding: Secondary | ICD-10-CM

## 2023-05-25 DIAGNOSIS — Z0000 Encounter for general adult medical examination without abnormal findings: Secondary | ICD-10-CM | POA: Diagnosis not present

## 2023-05-25 NOTE — Telephone Encounter (Signed)
Pt aware and she is scheduled

## 2023-05-25 NOTE — Telephone Encounter (Signed)
 noted

## 2023-05-25 NOTE — Progress Notes (Signed)
 Because this visit was a virtual/telehealth visit,  certain criteria was not obtained, such a blood pressure, CBG if applicable, and timed get up and go. Any medications not marked as "taking" were not mentioned during the medication reconciliation part of the visit. Any vitals not documented were not able to be obtained due to this being a telehealth visit or patient was unable to self-report a recent blood pressure reading due to a lack of equipment at home via telehealth. Vitals that have been documented are verbally provided by the patient.   Subjective:   Christina Williamson is a 68 y.o. who presents for a Medicare Wellness preventive visit.  Visit Complete: Virtual I connected with  Christina Williamson on 05/25/23 by a audio enabled telemedicine application and verified that I am speaking with the correct person using two identifiers.  Patient Location: Home  Provider Location: Home Office  I discussed the limitations of evaluation and management by telemedicine. The patient expressed understanding and agreed to proceed.  Vital Signs: Because this visit was a virtual/telehealth visit, some criteria may be missing or patient reported. Any vitals not documented were not able to be obtained and vitals that have been documented are patient reported.  VideoDeclined- This patient declined Librarian, academic. Therefore the visit was completed with audio only.  Persons Participating in Visit: Patient.  AWV Questionnaire: No: Patient Medicare AWV questionnaire was not completed prior to this visit.  Cardiac Risk Factors include: advanced age (>60men, >75 women);hypertension;dyslipidemia;smoking/ tobacco exposure;sedentary lifestyle     Objective:    Today's Vitals   05/25/23 1231 05/25/23 1232  Weight: 169 lb (76.7 kg)   Height: 5\' 3"  (1.6 m)   PainSc:  8    Body mass index is 29.94 kg/m.     05/25/2023   12:35 PM 01/29/2015    6:37 AM  Advanced Directives   Does Patient Have a Medical Advance Directive? No No  Would patient like information on creating a medical advance directive? No - Patient declined No - patient declined information    Current Medications (verified) Outpatient Encounter Medications as of 05/25/2023  Medication Sig   allopurinol (ZYLOPRIM) 100 MG tablet Take 1 tablet (100 mg total) by mouth daily.   amLODipine (NORVASC) 10 MG tablet TAKE ONE TABLET BY MOUTH DAILY.   chlorthalidone (HYGROTON) 25 MG tablet Take 25 mg by mouth daily.   hydrALAZINE (APRESOLINE) 50 MG tablet Take 1 tablet (50 mg total) by mouth 3 (three) times daily.   valsartan (DIOVAN) 40 MG tablet Take 40 mg by mouth daily.   No facility-administered encounter medications on file as of 05/25/2023.    Allergies (verified) Ace inhibitors, Penicillins, and Prednisone   History: Past Medical History:  Diagnosis Date   Chronic kidney disease 1993   Hyperlipidemia    Hypertension 1993   Nicotine addiction 1978   0.5 PPD for 40 years   Obesity    Past Surgical History:  Procedure Laterality Date   COLONOSCOPY N/A 01/29/2015   Procedure: COLONOSCOPY;  Surgeon: Malissa Hippo, MD;  Location: [facility];  Service: Endoscopy;  Laterality: N/A;  730   PARTIAL HYSTERECTOMY  2002   secondary to fibroids    Family History  Problem Relation Age of Onset   Breast cancer Mother    Cancer Mother        breast   Hypertension Mother    Stroke Mother    Hypertension Father    Diabetes Father  Stroke Father    Cancer Daughter 63   Hypertension Brother    Hypertension Brother    Kidney disease Brother    Stroke Brother    Social History   Socioeconomic History   Marital status: Married    Spouse name: Not on file   Number of children: 1   Years of education: Not on file   Highest education level: High school graduate  Occupational History   Occupation: hairdresser   Tobacco Use   Smoking status: Every Day    Current packs/day: 0.50     Average packs/day: 0.5 packs/day for 20.2 years (10.1 ttl pk-yrs)    Types: Cigarettes    Start date: 2005   Smokeless tobacco: Never   Tobacco comments:    3 per day   Vaping Use   Vaping status: Never Used  Substance and Sexual Activity   Alcohol use: No    Alcohol/week: 0.0 standard drinks of alcohol   Drug use: No   Sexual activity: Not Currently    Comment: spouse impotent  Other Topics Concern   Not on file  Social History Narrative   Not on file   Social Drivers of Health   Financial Resource Strain: Low Risk  (05/25/2023)   Overall Financial Resource Strain (CARDIA)    Difficulty of Paying Living Expenses: Not hard at all  Food Insecurity: No Food Insecurity (05/25/2023)   Hunger Vital Sign    Worried About Running Out of Food in the Last Year: Never true    Ran Out of Food in the Last Year: Never true  Transportation Needs: No Transportation Needs (05/25/2023)   PRAPARE - Administrator, Civil Service (Medical): No    Lack of Transportation (Non-Medical): No  Physical Activity: Inactive (05/25/2023)   Exercise Vital Sign    Days of Exercise per Week: 0 days    Minutes of Exercise per Session: 0 min  Stress: No Stress Concern Present (05/25/2023)   [employer] of Occupational Health - Occupational Stress Questionnaire    Feeling of Stress : Not at all  Social Connections: Socially Integrated (05/25/2023)   Social Connection and Isolation Panel [NHANES]    Frequency of Communication with Friends and Family: More than three times a week    Frequency of Social Gatherings with Friends and Family: More than three times a week    Attends Religious Services: More than 4 times per year    Active Member of [employer] or Organizations: Yes    Attends Engineer, structural: More than 4 times per year    Marital Status: Married    Tobacco Counseling Ready to quit: Yes Counseling given: Yes Tobacco comments: 3 per day     Clinical Intake:  Pre-visit  preparation completed: Yes  Pain : 0-10 Pain Score: 8  Pain Type: Acute pain (gout flare in RT ankle) Pain Location: Ankle Pain Orientation: Right Pain Descriptors / Indicators: Constant, Aching, Sore, Other (Comment) (stiff) Pain Onset: Today Pain Frequency: Constant     BMI - recorded: 29.94 Nutritional Status: BMI 25 -29 Overweight Nutritional Risks: None Diabetes: No  Lab Results  Component Value Date   HGBA1C 5.5 08/07/2018   HGBA1C 5.7 (H) 08/01/2017   HGBA1C 5.6 01/25/2017     How often do you need to have someone help you when you read instructions, pamphlets, or other written materials from your doctor or pharmacy?: 1 - Never  Interpreter Needed?: No  Information entered by ::  W  CMA   Activities of Daily Living     05/25/2023   12:35 PM  In your present state of health, do you have any difficulty performing the following activities:  Hearing? 0  Vision? 0  Difficulty concentrating or making decisions? 0  Walking or climbing stairs? 0  Dressing or bathing? 0  Doing errands, shopping? 0  Preparing Food and eating ? N  Using the Toilet? N  In the past six months, have you accidently leaked urine? N  Do you have problems with loss of bowel control? N  Managing your Medications? N  Managing your Finances? N  Housekeeping or managing your Housekeeping? N    Patient Care Team: Kerri Perches, MD as PCP - General  Indicate any recent Medical Services you may have received from other than [facility] providers in the past year (date may be approximate).     Assessment:   This is a routine wellness examination for [place].  Hearing/Vision screen Hearing Screening - Comments:: Patient denies any hearing difficulties.   Vision Screening - Comments:: Patient is not up to date on yearly eye exams.  Patient's previous office no longer has a provider available. She states she will go to [facility].    Goals Addressed             This Visit's Progress     Patient Stated       I'd like to take a trip, maybe to the [place].        Depression Screen     05/25/2023   12:38 PM 05/17/2023    9:24 AM 01/11/2023    2:45 PM 10/05/2022    1:36 PM 03/31/2022   10:02 AM 11/10/2021    1:30 PM 07/07/2021    1:10 PM  PHQ 2/9 Scores  PHQ - 2 Score 0 0 0 3 0 0 0  PHQ- 9 Score 3 3  9  0      Fall Risk     05/25/2023   12:35 PM 05/17/2023    9:24 AM 01/11/2023    2:45 PM 10/05/2022    1:36 PM 03/31/2022   10:01 AM  Fall Risk   Falls in the past year? 0 0 0 0 0  Number falls in past yr: 0 0 0 0 0  Injury with Fall? 0 0 0 0 0  Risk for fall due to : No Fall Risks No Fall Risks No Fall Risks No Fall Risks No Fall Risks  Follow up Falls prevention discussed;Falls evaluation completed;Education provided Falls evaluation completed Falls evaluation completed Falls evaluation completed Falls evaluation completed    MEDICARE RISK AT HOME:  Medicare Risk at Home Any stairs in or around the home?: No If so, are there any without handrails?: No Home free of loose throw rugs in walkways, pet beds, electrical cords, etc?: Yes Adequate lighting in your home to reduce risk of falls?: Yes Life alert?: No Use of a cane, walker or w/c?: No Grab bars in the bathroom?: No Shower chair or bench in shower?: No Elevated toilet seat or a handicapped toilet?: No  TIMED UP AND GO:  Was the test performed?  No  Cognitive Function: 6CIT completed        05/25/2023   12:33 PM  6CIT Screen  What Year? 0 points  What month? 0 points  What time? 0 points  Count back from 20 0 points  Months in reverse 0 points  Repeat phrase 0 points  Total Score  0 points    Immunizations Immunization History  Administered Date(s) Administered   Fluad Trivalent(High Dose 65+) 11/29/2022   Influenza Whole 01/01/2010   Influenza,inj,Quad PF,6+ Mos 02/13/2013, 02/11/2014, 03/31/2015, 01/26/2016, 02/01/2017, 12/26/2018, 11/06/2019, 04/07/2021, 11/10/2021   Moderna Covid-19 Fall  Seasonal Vaccine 35yrs & older 11/29/2022   Moderna Covid-19 Vaccine Bivalent Booster 78yrs & up 02/03/2022   Moderna Sars-Covid-2 Vaccination 05/12/2019, 06/13/2019, 01/16/2020   PNEUMOCOCCAL CONJUGATE-20 03/31/2022   Pneumococcal Polysaccharide-23 08/19/2020   Td 08/29/2008   Tdap 05/09/2020   Zoster Recombinant(Shingrix) 08/02/2017, 04/07/2021    Screening Tests Health Maintenance  Topic Date Due   Colonoscopy  01/28/2022   COVID-19 Vaccine (6 - 2024-25 season) 05/29/2023   DEXA SCAN  04/06/2024   MAMMOGRAM  04/24/2024   Medicare Annual Wellness (AWV)  05/24/2024   DTaP/Tdap/Td (3 - Td or Tdap) 05/10/2030   Pneumonia Vaccine 43+ Years old  Completed   INFLUENZA VACCINE  Completed   Hepatitis C Screening  Completed   Zoster Vaccines- Shingrix  Completed   HPV VACCINES  Aged Out    Health Maintenance  Health Maintenance Due  Topic Date Due   Colonoscopy  01/28/2022   COVID-19 Vaccine (6 - 2024-25 season) 05/29/2023   Health Maintenance Items Addressed: Patient is past due for colonoscopy. Patient has paperwork to complete and return so that she can schedule appointment  Additional Screening:  Vision Screening: Recommended annual ophthalmology exams for early detection of glaucoma and other disorders of the eye.  Dental Screening: Recommended annual dental exams for proper oral hygiene  Community Resource Referral / Chronic Care Management: CRR required this visit?  No   CCM required this visit?  No     Plan:     I have personally reviewed and noted the following in the patient's chart:   Medical and social history Use of alcohol, tobacco or illicit drugs  Current medications and supplements including opioid prescriptions. Patient is not currently taking opioid prescriptions. Functional ability and status Nutritional status Physical activity Advanced directives List of other physicians Hospitalizations, surgeries, and ER visits in previous 12  months Vitals Screenings to include cognitive, depression, and falls Referrals and appointments  In addition, I have reviewed and discussed with patient certain preventive protocols, quality metrics, and best practice recommendations. A written personalized care plan for preventive services as well as general preventive health recommendations were provided to patient.     Jordan Hawks Daveda Larock, CMA   05/25/2023   After Visit Summary: (MyChart) Due to this being a telephonic visit, the after visit summary with patients personalized plan was offered to patient via MyChart   Notes: Please refer to Routing Comments.

## 2023-05-25 NOTE — Patient Instructions (Signed)
 Ms. Semel , Thank you for taking time to come for your Medicare Wellness Visit. I appreciate your ongoing commitment to your health goals. Please review the following plan we discussed and let me know if I can assist you in the future.   Referrals/Orders/Follow-Ups/Clinician Recommendations:  Next Medicare Annual Wellness Visit:   May 28, 2024 at 12:30pm video visit.   This is a list of the screening recommended for you and due dates:  Health Maintenance  Topic Date Due   Colon Cancer Screening  01/28/2022   COVID-19 Vaccine (6 - 2024-25 season) 05/29/2023   DEXA scan (bone density measurement)  04/06/2024   Mammogram  04/24/2024   Medicare Annual Wellness Visit  05/24/2024   DTaP/Tdap/Td vaccine (3 - Td or Tdap) 05/10/2030   Pneumonia Vaccine  Completed   Flu Shot  Completed   Hepatitis C Screening  Completed   Zoster (Shingles) Vaccine  Completed   HPV Vaccine  Aged Out    Advanced directives: (ACP Link)Information on Advanced Care Planning can be found at [facility] Advance Health Care Directives Advance Health Care Directives. [email or web address]   Next Medicare Annual Wellness Visit scheduled for next year: yes  Understanding Your Risk for Falls Millions of people have serious injuries from falls each year. It is important to understand your risk of falling. Talk with your health care provider about your risk and what you can do to lower it. If you do have a serious fall, make sure to tell your provider. Falling once raises your risk of falling again. How can falls affect me? Serious injuries from falls are common. These include: Broken bones, such as hip fractures. Head injuries, such as traumatic brain injuries (TBI) or concussions. A fear of falling can cause you to avoid activities and stay at home. This can make your muscles weaker and raise your risk for a fall. What can increase my risk? There are a number of risk factors that increase your risk for  falling. The more risk factors you have, the higher your risk of falling. Serious injuries from a fall happen most often to people who are older than 67 years old. Teenagers and young adults ages 88-29 are also at higher risk. Common risk factors include: Weakness in the lower body. Being generally weak or confused due to long-term (chronic) illness. Dizziness or balance problems. Poor vision. Medicines that cause dizziness or drowsiness. These may include: Medicines for your blood pressure, heart, anxiety, insomnia, or swelling (edema). Pain medicines. Muscle relaxants. Other risk factors include: Drinking alcohol. Having had a fall in the past. Having foot pain or wearing improper footwear. Working at a dangerous job. Having any of the following in your home: Tripping hazards, such as floor clutter or loose rugs. Poor lighting. Pets. Having dementia or memory loss. What actions can I take to lower my risk of falling?     Physical activity Stay physically fit. Do strength and balance exercises. Consider taking a regular class to build strength and balance. Yoga and tai chi are good options. Vision Have your eyes checked every year and your prescription for glasses or contacts updated as needed. Shoes and walking aids Wear non-skid shoes. Wear shoes that have rubber soles and low heels. Do not wear high heels. Do not walk around the house in socks or slippers. Use a cane or walker as told by your provider. Home safety Attach secure railings on both sides of your stairs. Install grab bars for your bathtub,  shower, and toilet. Use a non-skid mat in your bathtub or shower. Attach bath mats securely with double-sided, non-slip rug tape. Use good lighting in all rooms. Keep a flashlight near your bed. Make sure there is a clear path from your bed to the bathroom. Use night-lights. Do not use throw rugs. Make sure all carpeting is taped or tacked down securely. Remove all clutter  from walkways and stairways, including extension cords. Repair uneven or broken steps and floors. Avoid walking on icy or slippery surfaces. Walk on the grass instead of on icy or slick sidewalks. Use ice melter to get rid of ice on walkways in the winter. Use a cordless phone. Questions to ask your health care provider Can you help me check my risk for a fall? Do any of my medicines make me more likely to fall? Should I take a vitamin D supplement? What exercises can I do to improve my strength and balance? Should I make an appointment to have my vision checked? Do I need a bone density test to check for weak bones (osteoporosis)? Would it help to use a cane or a walker? Where to find more information Centers for Disease Control and Prevention, STEADI: TonerPromos.no Community-Based Fall Prevention Programs: TonerPromos.no [employer] on Aging: BaseRingTones.pl Contact a health care provider if: You fall at home. You are afraid of falling at home. You feel weak, drowsy, or dizzy. This information is not intended to replace advice given to you by your health care provider. Make sure you discuss any questions you have with your health care provider. Document Revised: 10/19/2021 Document Reviewed: 10/19/2021 Elsevier Patient Education  2024 [employer].

## 2023-05-25 NOTE — Telephone Encounter (Signed)
 Pls call let pt know I got message from [facility] ED regarding ED visit on 03/24 NEEDS f/U with GI for acute diverticulitis and liver cysts , I am sending urgent referral to Dr Earmon Phoenix Also needs gyne eval due to left overian cyst, I am sending a referral to Gyne  ( [facility] Needs ED follow up in office in 1 to 2 weeks thanks

## 2023-05-30 ENCOUNTER — Encounter (INDEPENDENT_AMBULATORY_CARE_PROVIDER_SITE_OTHER): Payer: Self-pay | Admitting: *Deleted

## 2023-05-30 ENCOUNTER — Encounter: Payer: Self-pay | Admitting: *Deleted

## 2023-05-31 ENCOUNTER — Ambulatory Visit: Admitting: Family Medicine

## 2023-06-19 NOTE — Progress Notes (Deleted)
   Christina Williamson, female    DOB: [birth year]    MRN: [id number]   Brief patient profile:  ***  yo*** *** former [facility] pt self-referred back to pulmonary clinic in [facility]  06/21/2023  for ***      History of Present Illness  06/21/2023  Pulmonary/ 1st office eval/ [facility]  No chief complaint on file.    Dyspnea:  *** Cough: *** Sleep: *** SABA use: *** 02 use:*** LDSCT:***  No obvious day to day or daytime pattern/variability or assoc excess/ purulent sputum or mucus plugs or hemoptysis or cp or chest tightness, subjective wheeze or overt sinus or hb symptoms.    Also denies any obvious fluctuation of symptoms with weather or environmental changes or other aggravating or alleviating factors except as outlined above   No unusual exposure hx or h/o childhood pna/ asthma or knowledge of premature birth.  Current Allergies, Complete Past Medical History, Past Surgical History, Family History, and Social History were reviewed in [employer] record.  ROS  The following are not active complaints unless bolded Hoarseness, sore throat, dysphagia, dental problems, itching, sneezing,  nasal congestion or discharge of excess mucus or purulent secretions, ear ache,   fever, chills, sweats, unintended wt loss or wt gain, classically pleuritic or exertional cp,  orthopnea pnd or arm/hand swelling  or leg swelling, presyncope, palpitations, abdominal pain, anorexia, nausea, vomiting, diarrhea  or change in bowel habits or change in bladder habits, change in stools or change in urine, dysuria, hematuria,  rash, arthralgias, visual complaints, headache, numbness, weakness or ataxia or problems with walking or coordination,  change in mood or  memory.            Outpatient Medications Prior to Visit  Medication Sig Dispense Refill   allopurinol  (ZYLOPRIM ) 100 MG tablet Take 1 tablet (100 mg total) by mouth daily. 30 tablet 6   amLODipine  (NORVASC ) 10 MG  tablet TAKE ONE TABLET BY MOUTH DAILY. 90 tablet 1   chlorthalidone (HYGROTON) 25 MG tablet Take 25 mg by mouth daily.     hydrALAZINE (APRESOLINE) 50 MG tablet Take 1 tablet (50 mg total) by mouth 3 (three) times daily.     valsartan (DIOVAN) 40 MG tablet Take 40 mg by mouth daily.     No facility-administered medications prior to visit.    Past Medical History:  Diagnosis Date   Chronic kidney disease 1993   Hyperlipidemia    Hypertension 1993   Nicotine  addiction 1978   0.5 PPD for 40 years   Obesity       Objective:     There were no vitals taken for this visit.         Assessment   No problem-specific Assessment & Plan notes found for this encounter.     Vernestine Gondola, MD 06/19/2023

## 2023-06-21 ENCOUNTER — Ambulatory Visit: Admitting: Internal Medicine

## 2023-06-27 ENCOUNTER — Ambulatory Visit (INDEPENDENT_AMBULATORY_CARE_PROVIDER_SITE_OTHER): Admitting: Gastroenterology

## 2023-06-27 ENCOUNTER — Encounter (INDEPENDENT_AMBULATORY_CARE_PROVIDER_SITE_OTHER): Payer: Self-pay | Admitting: Gastroenterology

## 2023-06-27 VITALS — BP 119/81 | HR 73 | Temp 97.9°F | Ht 63.0 in | Wt 166.6 lb

## 2023-06-27 DIAGNOSIS — Z8719 Personal history of other diseases of the digestive system: Secondary | ICD-10-CM

## 2023-06-27 DIAGNOSIS — K769 Liver disease, unspecified: Secondary | ICD-10-CM | POA: Insufficient documentation

## 2023-06-27 DIAGNOSIS — R932 Abnormal findings on diagnostic imaging of liver and biliary tract: Secondary | ICD-10-CM

## 2023-06-27 DIAGNOSIS — K5732 Diverticulitis of large intestine without perforation or abscess without bleeding: Secondary | ICD-10-CM | POA: Insufficient documentation

## 2023-06-27 DIAGNOSIS — Z860100 Personal history of colon polyps, unspecified: Secondary | ICD-10-CM

## 2023-06-27 NOTE — Patient Instructions (Addendum)
 Schedule colonoscopy Avoid using high dose aspirin including Goody/BC powders, NSAIDs such as Aleve, ibuprofen , naproxen, Motrin , Voltaren or Advil  (even the topical ones) Schedule MRI abdomen with and without contrast

## 2023-06-27 NOTE — Progress Notes (Signed)
 Samantha Cress, M.D. Gastroenterology & Hepatology [facility] Gastroenterology [address] [place], [place] [address] Primary Care Physician: Towanda Fret, MD [address] [place] [address]  Referring MD: PCP  Chief Complaint: Recent episode of diverticulitis  History of Present Illness: Christina Williamson is a 68 y.o. female with past medical history of CKD, hyperlipidemia, hypertension, who presents for evaluation of recent episode of diverticulitis.  Patient was seen at [facility] on 3/25/2025After presenting left lower quadrant pain and some constipation for 2 weeks.  Labs at that time showed CMP with alkaline phosphatase mildly elevated 130, AST 2021 ALT 15, total bilirubin 0.3, creatinine 2.30, BUN 48, normal lites, uric acid was elevated at 8.1, CBC with hemoglobin 11.9, WBC 7.4 and platelets 331.  CT abdomen and pelvis without contrast showed presence of sigmoid diverticulitis without complication and a 3.2 cm left adnexal cyst.  There were also numerous low attenuation liver lesions of unclear etiology.  Patient was discharged home on ciprofloxacin and metronidazole for 10 days.  She took ibuprofen  when her abdominal pain started, but not before symptom onset.  Patient reports that after receiving the antibiotics, her symptoms completely resolved. Denies having any nausea, vomiting, fever, chills, hematochezia, melena, hematemesis, abdominal distention, abdominal pain, diarrhea, jaundice, pruritus. Has been losing weight on purpose.  Last WGN:FAOZH Last Colonoscopy:2016 Prep excellent. Small polyp ablated via cold biopsy from cecum. 4 mm polyp cold snare from proximal transverse colon. Both of these polyps were submitted together. Few diverticula at transverse, descending and sigmoid colon. Normal rectal mucosa and anal rectal junction.  Patient had 2 small polyps removed and one is tubular adenoma.   Recommend repeat  colonoscopy in 7 years.  FHx: neg for any gastrointestinal/liver disease, no malignancies Social: smokes 3 cigs per day, neg alcohol or illicit drug use Surgical: hysterectomy  Past Medical History: Past Medical History:  Diagnosis Date   Chronic kidney disease 36   Hyperlipidemia    Hypertension 1993   Nicotine  addiction 1978   0.5 PPD for 40 years   Obesity     Past Surgical History: Past Surgical History:  Procedure Laterality Date   COLONOSCOPY N/A 01/29/2015   Procedure: COLONOSCOPY;  Surgeon: Ruby Corporal, MD;  Location: [facility];  Service: Endoscopy;  Laterality: N/A;  730   PARTIAL HYSTERECTOMY  2002   secondary to fibroids     Family History: Family History  Problem Relation Age of Onset   Breast cancer Mother    Cancer Mother        breast   Hypertension Mother    Stroke Mother    Hypertension Father    Diabetes Father    Stroke Father    Cancer Daughter 39   Hypertension Brother    Hypertension Brother    Kidney disease Brother    Stroke Brother     Social History: Social History   Tobacco Use  Smoking Status Every Day   Current packs/day: 0.50   Average packs/day: 0.5 packs/day for 20.3 years (10.2 ttl pk-yrs)   Types: Cigarettes   Start date: 2005  Smokeless Tobacco Never  Tobacco Comments   3 per day    Social History   Substance and Sexual Activity  Alcohol Use No   Alcohol/week: 0.0 standard drinks of alcohol   Social History   Substance and Sexual Activity  Drug Use No    Allergies: Allergies  Allergen Reactions   Ace Inhibitors Other (  See Comments)    Patient cannot remember reaction.   Penicillins     Has patient had a PCN reaction causing immediate rash, facial/tongue/throat swelling, SOB or lightheadedness with hypotension: No Has patient had a PCN reaction causing severe rash involving mucus membranes or skin necrosis: No Has patient had a PCN reaction that required hospitalization No Has patient had a PCN  reaction occurring within the last 10 years: No If all of the above answers are "NO", then may proceed with Cephalosporin use.    Prednisone  Palpitations    Medications: Current Outpatient Medications  Medication Sig Dispense Refill   allopurinol  (ZYLOPRIM ) 100 MG tablet Take 1 tablet (100 mg total) by mouth daily. 30 tablet 6   amLODipine  (NORVASC ) 10 MG tablet TAKE ONE TABLET BY MOUTH DAILY. 90 tablet 1   chlorthalidone (HYGROTON) 25 MG tablet Take 25 mg by mouth daily.     hydrALAZINE (APRESOLINE) 50 MG tablet Take 1 tablet (50 mg total) by mouth 3 (three) times daily.     valsartan (DIOVAN) 40 MG tablet Take 40 mg by mouth daily.     No current facility-administered medications for this visit.    Review of Systems: GENERAL: negative for malaise, night sweats HEENT: No changes in hearing or vision, no nose bleeds or other nasal problems. NECK: Negative for lumps, goiter, pain and significant neck swelling RESPIRATORY: Negative for cough, wheezing CARDIOVASCULAR: Negative for chest pain, leg swelling, palpitations, orthopnea GI: SEE HPI MUSCULOSKELETAL: Negative for joint pain or swelling, back pain, and muscle pain. SKIN: Negative for lesions, rash PSYCH: Negative for sleep disturbance, mood disorder and recent psychosocial stressors. HEMATOLOGY Negative for prolonged bleeding, bruising easily, and swollen nodes. ENDOCRINE: Negative for cold or heat intolerance, polyuria, polydipsia and goiter. NEURO: negative for tremor, gait imbalance, syncope and seizures. The remainder of the review of systems is noncontributory.   Physical Exam: BP 119/81   Pulse 73   Temp 97.9 F (36.6 C) (Oral)   Ht 5\' 3"  (1.6 m)   Wt 166 lb 9.6 oz (75.6 kg)   BMI 29.51 kg/m  GENERAL: The patient is AO x3, in no acute distress. HEENT: Head is normocephalic and atraumatic. EOMI are intact. Mouth is well hydrated and without lesions. NECK: Supple. No masses LUNGS: Clear to auscultation. No  presence of rhonchi/wheezing/rales. Adequate chest expansion HEART: RRR, normal s1 and s2. ABDOMEN: Soft, nontender, no guarding, no peritoneal signs, and nondistended. BS +. No masses. EXTREMITIES: Without any cyanosis, clubbing, rash, lesions or edema. NEUROLOGIC: AOx3, no focal motor deficit. SKIN: no jaundice, no rashes   Imaging/Labs: as above  I personally reviewed and interpreted the available labs, imaging and endoscopic files.  Impression and Plan: Christina Williamson is a 68 y.o. female with past medical history of CKD, hyperlipidemia, hypertension, who presents for evaluation of recent episode of diverticulitis.  Patient has presented 1 isolated episode of diverticulitis.  There were no triggers for her episode.  She denies any other associated symptoms and her symptoms have resolved after receiving an antibiotic course.  Patient has a history of colon polyps and is due for colorectal cancer screening with colonoscopy.  We discussed that colonoscopy is needed regardless given her history of diverticulitis.  Finally, she had imaging changes concerning for new liver lesions of unclear etiology.  Will proceed with MR of the abdomen with and without IV contrast as CT scan will not be possible to be performed with contrast given her kidney function.  -Schedule colonoscopy -Avoid using  high dose aspirin including Goody/BC powders, NSAIDs such as Aleve, ibuprofen , naproxen, Motrin , Voltaren or Advil  (even the topical ones) -Schedule MRI abdomen with and without contrast  All questions were answered.      Samantha Cress, MD Gastroenterology and Hepatology [facility]astroenterology

## 2023-07-07 ENCOUNTER — Telehealth (INDEPENDENT_AMBULATORY_CARE_PROVIDER_SITE_OTHER): Payer: Self-pay | Admitting: Gastroenterology

## 2023-07-07 DIAGNOSIS — K769 Liver disease, unspecified: Secondary | ICD-10-CM

## 2023-07-07 MED ORDER — PEG 3350-KCL-NA BICARB-NACL 420 G PO SOLR: 4000.0000 mL | Freq: Once | ORAL | 0 refills | Status: AC

## 2023-07-07 NOTE — Telephone Encounter (Signed)
 Pt contacted and MRI and TCS scheudled.  MRI-08/08/23 at 9:30am pt to arrive at 9 am NPO 4 hours prior. Will put appt on my chart. PA approved via [employer] [id number] Valid dates 07/07/23-01/03/24 TCS-08/02/23 @9 :45AM. Instructions placed on my chart. No pa needed per insurance. Prep sent to pharmacy

## 2023-07-08 ENCOUNTER — Encounter (INDEPENDENT_AMBULATORY_CARE_PROVIDER_SITE_OTHER): Payer: Self-pay

## 2023-07-18 ENCOUNTER — Ambulatory Visit: Admitting: Obstetrics & Gynecology

## 2023-07-18 ENCOUNTER — Encounter: Payer: Self-pay | Admitting: Obstetrics & Gynecology

## 2023-07-18 VITALS — BP 153/91 | HR 81 | Ht 63.0 in

## 2023-07-18 DIAGNOSIS — N83202 Unspecified ovarian cyst, left side: Secondary | ICD-10-CM | POA: Diagnosis not present

## 2023-07-18 NOTE — Progress Notes (Signed)
 Follow up appointment for results: Left ovarian cyst  Chief Complaint  Patient presents with   Ovarian Cyst         Blood pressure (!) 153/91, pulse 81, height 5\' 3"  (1.6 m).  I looked at all previous scans and none involve the pelvis This is likely a simple process but will order ROMA + sonogram to better characterize  Has been treated for diverticulitis in March, has appt with Dr Sammi Crick gastroenterology  Exam: CT of the Abdomen and Pelvis without Contrast  History: Left lower quadrant pain  Technique: Routine CT of the abdomen and pelvis without IV contrast. AEC (automated exposure control) and/or manual techniques such as size-specific kV and mAs are employed where appropriate to reduce radiation exposure for all CT exams.  Comparison:  None  Findings:  LOWER THORAX:  Bibasilar subsegmental atelectasis.  LIVER:  4.4 cm cyst at the hepatic dome. This appears benign and requires no further follow-up. Additional subcentimeter hypoattenuating lesions are scattered throughout the liver, too small to characterize.  BILIARY: No radiopaque stones or sludge. No evidence of cholecystitis. No significant intra- or extrahepatic biliary ductal dilation.  PANCREAS: Normal background parenchyma. No focal lesion. Main pancreatic duct normal in caliber.  SPLEEN: Normal size.  ADRENALS:  Normal morphology.  KIDNEYS/URETERS:  No hydroureteronephrosis. No nephroureteral calculus. No suspicious lesion.  BOWEL:  No bowel structure. Sigmoid diverticulosis. Inflammatory stranding adjacent to the sigmoid colon (2:107). Normal appendix.  VASCULAR:  Normal caliber abdominal aorta with relatively severe calcified plaque.  LYMPH NODES:  No lymphadenopathy.  PERITONEUM: No ascites.  PELVIC ORGANS:  3.2 cm left adnexal cyst. Urinary bladder is grossly unremarkable.  BONES:  No aggressive lesion. No acute abnormality.  SOFT TISSUES: Normal. Procedure Note  Lessie Ratel, MD -  05/24/2023 Formatting of this note might be different from the original. Exam: CT of the Abdomen and Pelvis without Contrast  History: Left lower quadrant pain  Technique: Routine CT of the abdomen and pelvis without IV contrast. AEC (automated exposure control) and/or manual techniques such as size-specific kV and mAs are employed where appropriate to reduce radiation exposure for all CT exams.  Comparison:  None  Findings:  LOWER THORAX:  Bibasilar subsegmental atelectasis.  LIVER:  4.4 cm cyst at the hepatic dome. This appears benign and requires no further follow-up. Additional subcentimeter hypoattenuating lesions are scattered throughout the liver, too small to characterize.  BILIARY: No radiopaque stones or sludge. No evidence of cholecystitis. No significant intra- or extrahepatic biliary ductal dilation.  PANCREAS: Normal background parenchyma. No focal lesion. Main pancreatic duct normal in caliber.  SPLEEN: Normal size.  ADRENALS:  Normal morphology.  KIDNEYS/URETERS:  No hydroureteronephrosis. No nephroureteral calculus. No suspicious lesion.  BOWEL:  No bowel structure. Sigmoid diverticulosis. Inflammatory stranding adjacent to the sigmoid colon (2:107). Normal appendix.  VASCULAR:  Normal caliber abdominal aorta with relatively severe calcified plaque.  LYMPH NODES:  No lymphadenopathy.  PERITONEUM: No ascites.  PELVIC ORGANS:  3.2 cm left adnexal cyst. Urinary bladder is grossly unremarkable.  BONES:  No aggressive lesion. No acute abnormality.  SOFT TISSUES: Normal.  IMPRESSION:  - Sigmoid diverticulosis with associated adjacent inflammatory stranding, suggesting acute diverticulitis. No drainable fluid collection or free intraperitoneal air. Note that evaluation is somewhat limited given lack of IV contrast.  - 3.2 cm left adnexal cyst. Suggest nonemergent/outpatient pelvic ultrasound for further evaluation.  -  Numerous low-attenuation liver lesions are  indeterminate on this study. While these could represent benign cysts, suggest correlation  with any prior outside imaging versus nonemergent/outpatient MRI abdomen.  Signed (Electronic Signature): 05/24/2023 11:52 AM Signed By: Edgar Goods, MD Exam End: 05/24/23 11:38   Specimen Collected: 05/24/23 11:42 Last Resulted: 05/24/23 11:52  Received From: [facility]  Result Received: 05/24/23 15:47    MEDS ordered this encounter: No orders of the defined types were placed in this encounter.   Orders for this encounter: Orders Placed This Encounter  Procedures   Ovarian Malignancy Risk-ROMA    Impression + Management Plan   ICD-10-CM   1. Cyst of left ovary  N83.202 Ovarian Malignancy Risk-ROMA   ROMA + sonogram      Follow Up: Return in about 2 weeks (around 08/01/2023) for GYN sono, then see Dr Randolm Butte for evaluation.     All questions were answered.  Past Medical History:  Diagnosis Date   Chronic kidney disease 1993   Hyperlipidemia    Hypertension 1993   Nicotine  addiction 1978   0.5 PPD for 40 years   Obesity     Past Surgical History:  Procedure Laterality Date   COLONOSCOPY N/A 01/29/2015   Procedure: COLONOSCOPY;  Surgeon: Ruby Corporal, MD;  [facility];  Service: Endoscopy;  Laterality: N/A;  730   PARTIAL HYSTERECTOMY  2002   secondary to fibroids     OB History     Gravida  1   Para  1   Term  1   Preterm      AB      Living  1      SAB      IAB      Ectopic      Multiple      Live Births              Allergies  Allergen Reactions   Ace Inhibitors Other (See Comments)    Patient cannot remember reaction.   Penicillins     Has patient had a PCN reaction causing immediate rash, facial/tongue/throat swelling, SOB or lightheadedness with hypotension: No Has patient had a PCN reaction causing severe rash involving mucus membranes or skin necrosis: No Has patient had a PCN reaction that required hospitalization  No Has patient had a PCN reaction occurring within the last 10 years: No If all of the above answers are "NO", then may proceed with Cephalosporin use.    Prednisone  Palpitations    Social History   Socioeconomic History   Marital status: Married    Spouse name: Not on file   Number of children: 1   Years of education: Not on file   Highest education level: High school graduate  Occupational History   Occupation: hairdresser   Tobacco Use   Smoking status: Every Day    Current packs/day: 0.50    Average packs/day: 0.5 packs/day for 20.4 years (10.2 ttl pk-yrs)    Types: Cigarettes    Start date: 2005   Smokeless tobacco: Never   Tobacco comments:    3 per day   Vaping Use   Vaping status: Never Used  Substance and Sexual Activity   Alcohol use: No    Alcohol/week: 0.0 standard drinks of alcohol   Drug use: No   Sexual activity: Not Currently    Comment: spouse impotent  Other Topics Concern   Not on file  Social History Narrative   Not on file   Social Drivers of Health   Financial Resource Strain: Low Risk  (07/18/2023)   Overall Financial Resource  Strain (CARDIA)    Difficulty of Paying Living Expenses: Not hard at all  Food Insecurity: No Food Insecurity (07/18/2023)   Hunger Vital Sign    Worried About Running Out of Food in the Last Year: Never true    Ran Out of Food in the Last Year: Never true  Transportation Needs: No Transportation Needs (07/18/2023)   PRAPARE - Administrator, Civil Service (Medical): No    Lack of Transportation (Non-Medical): No  Physical Activity: Insufficiently Active (07/18/2023)   Exercise Vital Sign    Days of Exercise per Week: 2 days    Minutes of Exercise per Session: 30 min  Stress: No Stress Concern Present (07/18/2023)   [employer] of Occupational Health - Occupational Stress Questionnaire    Feeling of Stress : Not at all  Social Connections: Moderately Integrated (07/18/2023)   Social Connection and  Isolation Panel [NHANES]    Frequency of Communication with Friends and Family: Three times a week    Frequency of Social Gatherings with Friends and Family: Twice a week    Attends Religious Services: More than 4 times per year    Active Member of [employer] or Organizations: No    Attends Banker Meetings: Never    Marital Status: Married    Family History  Problem Relation Age of Onset   Breast cancer Mother    Cancer Mother        breast   Hypertension Mother    Stroke Mother    Hypertension Father    Diabetes Father    Stroke Father    Cancer Daughter 48   Hypertension Brother    Hypertension Brother    Kidney disease Brother    Stroke Brother

## 2023-07-19 ENCOUNTER — Encounter (INDEPENDENT_AMBULATORY_CARE_PROVIDER_SITE_OTHER): Payer: Self-pay

## 2023-07-19 ENCOUNTER — Encounter: Payer: Self-pay | Admitting: Family Medicine

## 2023-07-19 ENCOUNTER — Telehealth: Payer: Self-pay | Admitting: Family Medicine

## 2023-07-19 ENCOUNTER — Encounter (INDEPENDENT_AMBULATORY_CARE_PROVIDER_SITE_OTHER): Payer: Self-pay | Admitting: Gastroenterology

## 2023-07-19 ENCOUNTER — Ambulatory Visit: Payer: Self-pay

## 2023-07-19 LAB — OVARIAN MALIGNANCY RISK-ROMA
Cancer Antigen (CA) 125: 5 U/mL (ref 0.0–38.1)
HE4: 260 pmol/L — ABNORMAL HIGH (ref 0.0–96.5)
Postmenopausal ROMA: 2.44
Premenopausal ROMA: 7.92 — ABNORMAL HIGH

## 2023-07-19 LAB — PREMENOPAUSAL INTERP: HIGH

## 2023-07-19 LAB — POSTMENOPAUSAL INTERP: LOW

## 2023-07-19 NOTE — Telephone Encounter (Signed)
 Just called and states she called her gi doctor for medication refills.   No triage needed.

## 2023-07-19 NOTE — Telephone Encounter (Signed)
 Sounds like she needs an OV to be evaluated for recurrent diverticulitis. She may need repeat CT A/P for definitive diagnosis, but recommend OV ASAP. Looks like there are a couple opening tomorrow if she can make it. Looks like she has only ever seen Dr. Sammi Crick, so she can see any APP.   In the mean time, recommend backing down to a clear liquid diet.

## 2023-07-19 NOTE — Telephone Encounter (Signed)
Thank you,

## 2023-07-19 NOTE — Telephone Encounter (Signed)
 Copied from CRM [id number]. Topic: Clinical - Medication Refill >> Jul 19, 2023  8:16 AM Cynthia K wrote: Medication: ciprofloxacin HCl (CIPRO) 500 MG tabletAND metroNIDAZOLE (FLAGYL) 500 MG tablet  Has the patient contacted their pharmacy? Yes (Agent: If no, request that the patient contact the pharmacy for the refill. If patient does not wish to contact the pharmacy document the reason why and proceed with request.) (Agent: If yes, when and what did the pharmacy advise?) Pharmacy needs order to refill  This is the patient's preferred pharmacy:  [facility] [phone] - [place], [place] - [address] AT [facility] [address] [place] [address] Phone: [phone] Fax: [phone]  Is this the correct pharmacy for this prescription? Yes If no, delete pharmacy and type the correct one.   Has the prescription been filled recently? Yes  Is the patient out of the medication? Yes - Her stomach is starting to bother her like it did 05/24/23 when she went to the emergency room. She would like for Dr. Rodolph Clap to refill the medications listed above   Has the patient been seen for an appointment in the last year OR does the patient have an upcoming appointment? Yes  Can we respond through MyChart? Yes  Agent: Please be advised that Rx refills may take up to 3 business days. We ask that you follow-up with your pharmacy.

## 2023-07-19 NOTE — Telephone Encounter (Signed)
 Pt requested fills on the following: ciprofloxacin HCl (CIPRO) 500 MG tablet AND metroNIDAZOLE (FLAGYL) 500 MG tablet.  Nurse attempted to reach out to patient to triage: no answer: left voicemail for patient to return call.

## 2023-07-19 NOTE — Telephone Encounter (Signed)
  CRM [id number]. Topic: Clinical - Medication Refill >> Jul 19, 2023  8:16 AM Cynthia K wrote: Medication: ciprofloxacin HCl (CIPRO) 500 MG tablet AND metroNIDAZOLE (FLAGYL) 500 MG tablet   2nd attempt made to contact patient regarding medication request for the medications listed above: no answer: left voicemail.

## 2023-07-19 NOTE — Telephone Encounter (Signed)
 Leandro Proffer, could you please advise? Please see patient concerns below. She last saw Dr. Sammi Crick, on 06/27/2023. Thanks,

## 2023-07-20 ENCOUNTER — Encounter: Admitting: Gastroenterology

## 2023-07-20 NOTE — Telephone Encounter (Signed)
 Please let Dr. Sammi Crick know patient cancelled. He may want her to have CT prior to colonoscopy if recurrent diverticulitis suspected.   I was trying to move her up to the 10:30 spot in case CT needed and did say I could see her at 4pm if she could not move up.

## 2023-07-29 ENCOUNTER — Encounter (HOSPITAL_COMMUNITY)
Admission: RE | Admit: 2023-07-29 | Discharge: 2023-07-29 | Disposition: A | Discharge status: Home / Self Care | Source: Ambulatory Visit | Attending: Gastroenterology | Admitting: Gastroenterology

## 2023-07-29 ENCOUNTER — Encounter (HOSPITAL_COMMUNITY): Payer: Self-pay

## 2023-07-29 ENCOUNTER — Other Ambulatory Visit (HOSPITAL_COMMUNITY)

## 2023-08-02 ENCOUNTER — Ambulatory Visit (HOSPITAL_COMMUNITY)
Admission: RE | Admit: 2023-08-02 | Discharge: 2023-08-02 | Disposition: A | Discharge status: Home / Self Care | Attending: Gastroenterology | Admitting: Gastroenterology

## 2023-08-02 ENCOUNTER — Encounter (INDEPENDENT_AMBULATORY_CARE_PROVIDER_SITE_OTHER): Payer: Self-pay | Admitting: *Deleted

## 2023-08-02 ENCOUNTER — Ambulatory Visit (HOSPITAL_COMMUNITY): Admitting: Anesthesiology

## 2023-08-02 ENCOUNTER — Other Ambulatory Visit: Payer: Self-pay

## 2023-08-02 ENCOUNTER — Encounter (HOSPITAL_COMMUNITY)
Admission: RE | Disposition: A | Discharge status: Home / Self Care | Payer: Self-pay | Source: Home / Self Care | Attending: Gastroenterology

## 2023-08-02 ENCOUNTER — Encounter (HOSPITAL_COMMUNITY): Payer: Self-pay | Admitting: Gastroenterology

## 2023-08-02 DIAGNOSIS — N189 Chronic kidney disease, unspecified: Secondary | ICD-10-CM | POA: Diagnosis not present

## 2023-08-02 DIAGNOSIS — K635 Polyp of colon: Secondary | ICD-10-CM | POA: Insufficient documentation

## 2023-08-02 DIAGNOSIS — I129 Hypertensive chronic kidney disease with stage 1 through stage 4 chronic kidney disease, or unspecified chronic kidney disease: Secondary | ICD-10-CM | POA: Insufficient documentation

## 2023-08-02 DIAGNOSIS — K5730 Diverticulosis of large intestine without perforation or abscess without bleeding: Secondary | ICD-10-CM | POA: Insufficient documentation

## 2023-08-02 DIAGNOSIS — K5732 Diverticulitis of large intestine without perforation or abscess without bleeding: Secondary | ICD-10-CM | POA: Diagnosis not present

## 2023-08-02 DIAGNOSIS — Z860100 Personal history of colon polyps, unspecified: Secondary | ICD-10-CM

## 2023-08-02 DIAGNOSIS — F17210 Nicotine dependence, cigarettes, uncomplicated: Secondary | ICD-10-CM | POA: Insufficient documentation

## 2023-08-02 DIAGNOSIS — Z09 Encounter for follow-up examination after completed treatment for conditions other than malignant neoplasm: Secondary | ICD-10-CM | POA: Insufficient documentation

## 2023-08-02 HISTORY — PX: COLONOSCOPY: SHX5424

## 2023-08-02 LAB — HM COLONOSCOPY

## 2023-08-02 SURGERY — COLONOSCOPY
Anesthesia: General

## 2023-08-02 MED ORDER — PROPOFOL 10 MG/ML IV BOLUS
INTRAVENOUS | Status: DC | PRN
  Administered 2023-08-02: 100 mg via INTRAVENOUS

## 2023-08-02 MED ORDER — LACTATED RINGERS IV SOLN: INTRAVENOUS | Status: DC | PRN

## 2023-08-02 MED ORDER — PROPOFOL 500 MG/50ML IV EMUL
INTRAVENOUS | Status: DC | PRN
Start: 2023-08-02 — End: 2023-08-02
  Administered 2023-08-02: 200 ug/kg/min via INTRAVENOUS

## 2023-08-02 NOTE — Discharge Instructions (Signed)
 You are being discharged to home.  Resume your previous diet.  We are waiting for your pathology results.  Your physician has recommended a repeat colonoscopy for surveillance based on pathology results.

## 2023-08-02 NOTE — Anesthesia Preprocedure Evaluation (Signed)
 Anesthesia Evaluation  Patient identified by MRN, date of birth, ID band Patient awake    Reviewed: Allergy & Precautions, H&P , NPO status , Patient's Chart, lab work & pertinent test results, reviewed documented beta blocker date and time   Airway Mallampati: II  TM Distance: >3 FB Neck ROM: full    Dental no notable dental hx.    Pulmonary neg pulmonary ROS, Current Smoker   Pulmonary exam normal breath sounds clear to auscultation       Cardiovascular Exercise Tolerance: Good hypertension,  Rhythm:regular Rate:Normal     Neuro/Psych  PSYCHIATRIC DISORDERS  Depression     Neuromuscular disease    GI/Hepatic negative GI ROS, Neg liver ROS,,,  Endo/Other  negative endocrine ROS    Renal/GU Renal disease  negative genitourinary   Musculoskeletal   Abdominal   Peds  Hematology  (+) Blood dyscrasia, anemia   Anesthesia Other Findings   Reproductive/Obstetrics negative OB ROS                             Anesthesia Physical Anesthesia Plan  ASA: 2  Anesthesia Plan: General   Post-op Pain Management:    Induction:   PONV Risk Score and Plan: Propofol infusion  Airway Management Planned:   Additional Equipment:   Intra-op Plan:   Post-operative Plan:   Informed Consent: I have reviewed the patients History and Physical, chart, labs and discussed the procedure including the risks, benefits and alternatives for the proposed anesthesia with the patient or authorized representative who has indicated his/her understanding and acceptance.     Dental Advisory Given  Plan Discussed with: CRNA  Anesthesia Plan Comments:        Anesthesia Quick Evaluation

## 2023-08-02 NOTE — Op Note (Signed)
 [facility] Patient Name: Christina Williamson Procedure Date: 08/02/2023 8:58 AM MRN: [id number] Date of Birth: [birth year] Attending MD: Samantha Cress , , [id number] CSN: [id number] Age: 68 Admit Type: Outpatient Procedure:                Colonoscopy Indications:              Follow-up of diverticulitis Providers:                Samantha Cress, Alisa App, [place] Wilson,                            Technician, Theola Fitch Referring MD:              Medicines:                Monitored Anesthesia Care Complications:            No immediate complications. Estimated Blood Loss:     Estimated blood loss: none. Procedure:                Pre-Anesthesia Assessment:                           - Prior to the procedure, a History and Physical                            was performed, and patient medications, allergies                            and sensitivities were reviewed. The patient's                            tolerance of previous anesthesia was reviewed.                           - The risks and benefits of the procedure and the                            sedation options and risks were discussed with the                            patient. All questions were answered and informed                            consent was obtained.                           - ASA Grade Assessment: II - A patient with mild                            systemic disease.                           After obtaining informed consent, the colonoscope                            was passed under direct vision. Throughout the  procedure, the patient's blood pressure, pulse, and                            oxygen saturations were monitored continuously. The                            [id number]-[id number] ([id number]) scope was introduced through                            the anus and advanced to the the cecum, identified                            by appendiceal orifice and ileocecal valve. The                             colonoscopy was performed without difficulty. The                            patient tolerated the procedure well. The quality                            of the bowel preparation was good. Scope In: 9:18:59 AM Scope Out: 9:35:20 AM Scope Withdrawal Time: 0 hours 10 minutes 25 seconds  Total Procedure Duration: 0 hours 16 minutes 21 seconds  Findings:      The perianal and digital rectal examinations were normal.      A 3 mm polyp was found in the transverse colon. The polyp was sessile.       The polyp was removed with a cold snare. Resection and retrieval were       complete.      Scattered medium-mouthed and small-mouthed diverticula were found in the       sigmoid colon, descending colon and transverse colon.      The retroflexed view of the distal rectum and anal verge was normal and       showed no anal or rectal abnormalities. Impression:               - One 3 mm polyp in the transverse colon, removed                            with a cold snare. Resected and retrieved.                           - Diverticulosis in the sigmoid colon, in the                            descending colon and in the transverse colon.                           - The distal rectum and anal verge are normal on                            retroflexion view. Moderate Sedation:      Per Anesthesia Care Recommendation:           -  Discharge patient to home (ambulatory).                           - Resume previous diet.                           - Await pathology results.                           - Repeat colonoscopy for surveillance based on                            pathology results. Procedure Code(s):        --- Professional ---                           [phone], Colonoscopy, flexible; with removal of                            tumor(s), polyp(s), or other lesion(s) by snare                            technique Diagnosis Code(s):        --- Professional ---                            D12.3, Benign neoplasm of transverse colon (hepatic                            flexure or splenic flexure)                           K57.32, Diverticulitis of large intestine without                            perforation or abscess without bleeding                           K57.30, Diverticulosis of large intestine without                            perforation or abscess without bleeding CPT copyright 2022 American Medical Association. All rights reserved. The codes documented in this report are preliminary and upon coder review may  be revised to meet current compliance requirements. Samantha Cress, MD Samantha Cress,  08/02/2023 9:41:28 AM This report has been signed electronically. Number of Addenda: 0

## 2023-08-02 NOTE — Transfer of Care (Signed)
 Immediate Anesthesia Transfer of Care Note  Patient: Christina Williamson  Procedure(s) Performed: COLONOSCOPY  Patient Location: Endoscopy Unit  Anesthesia Type:General  Level of Consciousness: awake, alert , oriented, and patient cooperative  Airway & Oxygen Therapy: Patient Spontanous Breathing  Post-op Assessment: Report given to RN, Post -op Vital signs reviewed and stable, and Patient moving all extremities X 4  Post vital signs: Reviewed and stable  Last Vitals:  Vitals Value Taken Time  BP 111/68 08/02/23 0940  Temp 36.3 C 08/02/23 0940  Pulse 79 08/02/23 0940  Resp 18 08/02/23 0940  SpO2 99 % 08/02/23 0941    Last Pain:  Vitals:   08/02/23 0940  TempSrc: Oral  PainSc: 0-No pain      Patients Stated Pain Goal: 6 (08/02/23 0940)  Complications: No notable events documented.

## 2023-08-02 NOTE — H&P (Signed)
 Christina Williamson is an 68 y.o. female.   Chief Complaint: diverticulitis HPI: Christina Williamson is a 68 y.o. female with past medical history of CKD, hyperlipidemia, hypertension, who presents for evaluation of recent episode of diverticulitis.   The patient denies having any nausea, vomiting, fever, chills, hematochezia, melena, hematemesis, abdominal distention, abdominal pain, diarrhea, jaundice, pruritus or weight loss.   Past Medical History:  Diagnosis Date   Chronic kidney disease 1993   Hyperlipidemia    Hypertension 1993   Nicotine  addiction 1978   0.5 PPD for 40 years   Obesity     Past Surgical History:  Procedure Laterality Date   COLONOSCOPY N/A 01/29/2015   Procedure: COLONOSCOPY;  Surgeon: Ruby Corporal, MD;  Location: [facility];  Service: Endoscopy;  Laterality: N/A;  730   PARTIAL HYSTERECTOMY  2002   secondary to fibroids     Family History  Problem Relation Age of Onset   Breast cancer Mother    Cancer Mother        breast   Hypertension Mother    Stroke Mother    Hypertension Father    Diabetes Father    Stroke Father    Cancer Daughter 71   Hypertension Brother    Hypertension Brother    Kidney disease Brother    Stroke Brother    Social History:  reports that she has been smoking cigarettes. She started smoking about 20 years ago. She has a 10.2 pack-year smoking history. She has never used smokeless tobacco. She reports that she does not drink alcohol and does not use drugs.  Allergies:  Allergies  Allergen Reactions   Ace Inhibitors Other (See Comments)    Patient cannot remember reaction.   Penicillins     Has patient had a PCN reaction causing immediate rash, facial/tongue/throat swelling, SOB or lightheadedness with hypotension: No Has patient had a PCN reaction causing severe rash involving mucus membranes or skin necrosis: No Has patient had a PCN reaction that required hospitalization No Has patient had a PCN reaction occurring  within the last 10 years: No If all of the above answers are "NO", then may proceed with Cephalosporin use.    Prednisone  Palpitations    Medications Prior to Admission  Medication Sig Dispense Refill   allopurinol  (ZYLOPRIM ) 100 MG tablet Take 1 tablet (100 mg total) by mouth daily. 30 tablet 6   amLODipine  (NORVASC ) 10 MG tablet TAKE ONE TABLET BY MOUTH DAILY. 90 tablet 1   chlorthalidone (HYGROTON) 25 MG tablet Take 25 mg by mouth daily.     hydrALAZINE (APRESOLINE) 50 MG tablet Take 1 tablet (50 mg total) by mouth 3 (three) times daily.     valsartan (DIOVAN) 40 MG tablet Take 40 mg by mouth daily.      No results found for this or any previous visit (from the past 48 hours). No results found.  Review of Systems  All other systems reviewed and are negative.   Blood pressure (!) 156/83, pulse 66, temperature 97.8 F (36.6 C), temperature source Oral, resp. rate 17, height 5\' 3"  (1.6 m), weight 75.6 kg, SpO2 98%. Physical Exam  GENERAL: The patient is AO x3, in no acute distress. HEENT: Head is normocephalic and atraumatic. EOMI are intact. Mouth is well hydrated and without lesions. NECK: Supple. No masses LUNGS: Clear to auscultation. No presence of rhonchi/wheezing/rales. Adequate chest expansion HEART: RRR, normal s1 and s2. ABDOMEN: Soft, nontender, no guarding, no peritoneal signs, and nondistended. BS +.  No masses. EXTREMITIES: Without any cyanosis, clubbing, rash, lesions or edema. NEUROLOGIC: AOx3, no focal motor deficit. SKIN: no jaundice, no rashes  Assessment/Plan Christina Williamson is a 68 y.o. female with past medical history of CKD, hyperlipidemia, hypertension, who presents for evaluation of recent episode of diverticulitis.  Will proceed with colonoscopy.  Urban Garden, MD 08/02/2023, 8:28 AM

## 2023-08-03 ENCOUNTER — Encounter (HOSPITAL_COMMUNITY): Payer: Self-pay | Admitting: Gastroenterology

## 2023-08-04 LAB — SURGICAL PATHOLOGY

## 2023-08-04 NOTE — Anesthesia Postprocedure Evaluation (Signed)
 Anesthesia Post Note  Patient: Christina Williamson  Procedure(s) Performed: COLONOSCOPY  Patient location during evaluation: Phase II Anesthesia Type: General Level of consciousness: awake Pain management: pain level controlled Vital Signs Assessment: post-procedure vital signs reviewed and stable Respiratory status: spontaneous breathing and respiratory function stable Cardiovascular status: blood pressure returned to baseline and stable Postop Assessment: no headache and no apparent nausea or vomiting Anesthetic complications: no Comments: Late entry   No notable events documented.   Last Vitals:  Vitals:   08/02/23 0941 08/02/23 0947  BP:  (!) 143/84  Pulse:    Resp:    Temp:    SpO2: 99%     Last Pain:  Vitals:   08/02/23 0940  TempSrc: Oral  PainSc: 0-No pain                 Coretha Dew

## 2023-08-05 ENCOUNTER — Other Ambulatory Visit: Payer: Self-pay | Admitting: Obstetrics & Gynecology

## 2023-08-05 DIAGNOSIS — N83202 Unspecified ovarian cyst, left side: Secondary | ICD-10-CM

## 2023-08-06 ENCOUNTER — Ambulatory Visit (INDEPENDENT_AMBULATORY_CARE_PROVIDER_SITE_OTHER): Payer: Self-pay | Admitting: Gastroenterology

## 2023-08-08 ENCOUNTER — Ambulatory Visit: Admitting: Obstetrics & Gynecology

## 2023-08-08 ENCOUNTER — Ambulatory Visit

## 2023-08-08 ENCOUNTER — Encounter: Payer: Self-pay | Admitting: Obstetrics & Gynecology

## 2023-08-08 ENCOUNTER — Ambulatory Visit (HOSPITAL_COMMUNITY)
Admission: RE | Admit: 2023-08-08 | Discharge: 2023-08-08 | Disposition: A | Discharge status: Home / Self Care | Source: Ambulatory Visit | Attending: Gastroenterology | Admitting: Gastroenterology

## 2023-08-08 ENCOUNTER — Ambulatory Visit (INDEPENDENT_AMBULATORY_CARE_PROVIDER_SITE_OTHER): Payer: Self-pay | Admitting: Gastroenterology

## 2023-08-08 VITALS — BP 124/78 | Ht 63.0 in | Wt 165.0 lb

## 2023-08-08 DIAGNOSIS — K769 Liver disease, unspecified: Secondary | ICD-10-CM | POA: Insufficient documentation

## 2023-08-08 DIAGNOSIS — N83202 Unspecified ovarian cyst, left side: Secondary | ICD-10-CM

## 2023-08-08 MED ORDER — GADOBUTROL 1 MMOL/ML IV SOLN
7.0000 mL | Freq: Once | INTRAVENOUS | Status: AC | PRN
  Administered 2023-08-08: 7 mL via INTRAVENOUS

## 2023-08-08 NOTE — Progress Notes (Signed)
 PELVIC US  TA/TV: normal vaginal cuff,normal ovaries,limited view of right ovary,no free fluid  Chaperone Mylinda Asa

## 2023-08-08 NOTE — Progress Notes (Signed)
 Follow up appointment for results: sonogram  Chief Complaint  Patient presents with   Follow-up    U/s    Blood pressure 124/78, height 5\' 3"  (1.6 m), weight 165 lb (74.8 kg).  US  PELVIC COMPLETE WITH TRANSVAGINAL Result Date: 08/08/2023 Images from the original result were not included.  ..an Financial trader of Ultrasound Medicine Technical sales engineer) accredited practice Center for [facility] [address] [place] [address] Ordering Provider: Wendelyn Halter, MD                                                                                                                                   GYNECOLOGIC SONOGRAM Christina Williamson is a 68 y.o. G1P1001 No LMP recorded. Patient has had a hysterectomy. She is here for a pelvic sonogram to evaluate left ovarian cyst seen on CT. Uterus                      surgically removed,normal vaginal cuff Endometrium          N/A Right ovary             1.5 x .7 x 1 cm, normal (limited view) Left ovary                1.5 x 1.4 x 1.2 cm, normal No free fluid Technician Comments: PELVIC US  TA/TV: normal vaginal cuff,normal ovaries,limited view of right ovary,no free fluid Chaperone [address] Cathy Cobbs 08/08/2023 12:16 PM Clinical Impression and recommendations: I have reviewed the sonogram results above, combined with the patient's current clinical course, below are my impressions and any appropriate recommendations for management based on the sonographic findings. Uterus absent Endometrium absent Ovaries: no evidence of ovarian cyst, both ovaries are small consistent with menopause, no pathology Wendelyn Halter 08/08/2023 12:27 PM    MR Abdomen W Wo Contrast Result Date: 08/08/2023 CLINICAL DATA:  Liver lesions. EXAM: MRI ABDOMEN WITHOUT AND WITH CONTRAST TECHNIQUE: Multiplanar multisequence MR imaging of the abdomen was performed both before and after the administration of intravenous contrast. CONTRAST:  7mL GADAVIST GADOBUTROL 1 MMOL/ML IV SOLN COMPARISON:   None Available. FINDINGS: Lower chest: No acute findings. Hepatobiliary: Numerous small cysts are seen throughout the liver, largest in segment 4A measuring 4.2 cm. No hepatic masses identified. Gallbladder is unremarkable. No evidence of biliary ductal dilatation. Pancreas:  No mass or inflammatory changes. Spleen:  Within normal limits in size and appearance. Adrenals/Urinary Tract: Normal adrenal glands. Multiple small benign Bosniak category 1 and 2 renal cysts are seen bilaterally (No followup imaging is recommended). No suspicious masses identified. No evidence of hydronephrosis. Stomach/Bowel: Diverticulosis is seen, however, no signs of diverticulitis involving the visualized portion. Vascular/Lymphatic: No pathologically enlarged lymph nodes identified. No acute vascular findings. Other:  None. Musculoskeletal:  No suspicious bone lesions identified. IMPRESSION: Numerous small benign hepatic cysts. No evidence of hepatic neoplasm. Multiple small benign  renal cysts, without evidence of renal neoplasm. Colonic diverticulosis. No radiographic evidence of diverticulitis. Electronically Signed   By: Marlyce Sine M.D.   On: 08/08/2023 11:27      MEDS ordered this encounter: No orders of the defined types were placed in this encounter.   Orders for this encounter: No orders of the defined types were placed in this encounter.   Impression + Management Plan   ICD-10-CM   1. Cyst of left ovary: sonogram is normal, no cyst seen on sonogram  N83.202       Follow Up: Return if symptoms worsen or fail to improve, for Follow up.     All questions were answered.  Past Medical History:  Diagnosis Date   Chronic kidney disease 1993   Hyperlipidemia    Hypertension 1993   Nicotine  addiction 1978   0.5 PPD for 40 years   Obesity     Past Surgical History:  Procedure Laterality Date   COLONOSCOPY N/A 01/29/2015   Procedure: COLONOSCOPY;  Surgeon: Ruby Corporal, MD;  [facility];  Service: Endoscopy;  Laterality: N/A;  730   COLONOSCOPY N/A 08/02/2023   Procedure: COLONOSCOPY;  Surgeon: Urban Garden, MD;  Location: [facility];  Service: Gastroenterology;  Laterality: N/A;  9:45AM;ASA 1   PARTIAL HYSTERECTOMY  2002   secondary to fibroids     OB History     Gravida  1   Para  1   Term  1   Preterm      AB      Living  1      SAB      IAB      Ectopic      Multiple      Live Births              Allergies  Allergen Reactions   Ace Inhibitors Other (See Comments)    Patient cannot remember reaction.   Penicillins     Has patient had a PCN reaction causing immediate rash, facial/tongue/throat swelling, SOB or lightheadedness with hypotension: No Has patient had a PCN reaction causing severe rash involving mucus membranes or skin necrosis: No Has patient had a PCN reaction that required hospitalization No Has patient had a PCN reaction occurring within the last 10 years: No If all of the above answers are "NO", then may proceed with Cephalosporin use.    Prednisone  Palpitations    Social History   Socioeconomic History   Marital status: Married    Spouse name: Not on file   Number of children: 1   Years of education: Not on file   Highest education level: High school graduate  Occupational History   Occupation: hairdresser   Tobacco Use   Smoking status: Every Day    Current packs/day: 0.50    Average packs/day: 0.5 packs/day for 20.4 years (10.2 ttl pk-yrs)    Types: Cigarettes    Start date: 2005   Smokeless tobacco: Never   Tobacco comments:    3 per day   Vaping Use   Vaping status: Never Used  Substance and Sexual Activity   Alcohol use: No    Alcohol/week: 0.0 standard drinks of alcohol   Drug use: No   Sexual activity: Not Currently    Comment: spouse impotent  Other Topics Concern   Not on file  Social History Narrative   Not on file   Social Drivers of Health   Financial Resource  Strain:  Low Risk  (07/18/2023)   Overall Financial Resource Strain (CARDIA)    Difficulty of Paying Living Expenses: Not hard at all  Food Insecurity: No Food Insecurity (07/18/2023)   Hunger Vital Sign    Worried About Running Out of Food in the Last Year: Never true    Ran Out of Food in the Last Year: Never true  Transportation Needs: No Transportation Needs (07/18/2023)   PRAPARE - Administrator, Civil Service (Medical): No    Lack of Transportation (Non-Medical): No  Physical Activity: Insufficiently Active (07/18/2023)   Exercise Vital Sign    Days of Exercise per Week: 2 days    Minutes of Exercise per Session: 30 min  Stress: No Stress Concern Present (07/18/2023)   [employer] of Occupational Health - Occupational Stress Questionnaire    Feeling of Stress : Not at all  Social Connections: Moderately Integrated (07/18/2023)   Social Connection and Isolation Panel [NHANES]    Frequency of Communication with Friends and Family: Three times a week    Frequency of Social Gatherings with Friends and Family: Twice a week    Attends Religious Services: More than 4 times per year    Active Member of [employer] or Organizations: No    Attends Banker Meetings: Never    Marital Status: Married    Family History  Problem Relation Age of Onset   Breast cancer Mother    Cancer Mother        breast   Hypertension Mother    Stroke Mother    Hypertension Father    Diabetes Father    Stroke Father    Cancer Daughter 57   Hypertension Brother    Hypertension Brother    Kidney disease Brother    Stroke Brother

## 2023-08-09 ENCOUNTER — Encounter (INDEPENDENT_AMBULATORY_CARE_PROVIDER_SITE_OTHER): Payer: Self-pay | Admitting: *Deleted

## 2023-08-09 ENCOUNTER — Other Ambulatory Visit: Admitting: Radiology

## 2023-09-14 ENCOUNTER — Telehealth: Payer: Self-pay

## 2023-09-14 NOTE — Telephone Encounter (Signed)
 Copied from CRM [id number]. Topic: Clinical - Medical Advice >> Sep 14, 2023  2:23 PM Winona R wrote: Pt has a question for Dr. Antonetta nurse. Refused to share the question or speak to anyone else.

## 2023-09-20 ENCOUNTER — Encounter: Payer: Self-pay | Admitting: Family Medicine

## 2023-09-20 ENCOUNTER — Ambulatory Visit (INDEPENDENT_AMBULATORY_CARE_PROVIDER_SITE_OTHER): Admitting: Family Medicine

## 2023-09-20 VITALS — BP 148/88 | HR 74 | Resp 16 | Ht 63.0 in | Wt 162.0 lb

## 2023-09-20 DIAGNOSIS — F17210 Nicotine dependence, cigarettes, uncomplicated: Secondary | ICD-10-CM

## 2023-09-20 DIAGNOSIS — E790 Hyperuricemia without signs of inflammatory arthritis and tophaceous disease: Secondary | ICD-10-CM | POA: Diagnosis not present

## 2023-09-20 DIAGNOSIS — I129 Hypertensive chronic kidney disease with stage 1 through stage 4 chronic kidney disease, or unspecified chronic kidney disease: Secondary | ICD-10-CM

## 2023-09-20 DIAGNOSIS — E663 Overweight: Secondary | ICD-10-CM

## 2023-09-20 DIAGNOSIS — F17200 Nicotine dependence, unspecified, uncomplicated: Secondary | ICD-10-CM | POA: Diagnosis not present

## 2023-09-20 DIAGNOSIS — N184 Chronic kidney disease, stage 4 (severe): Secondary | ICD-10-CM

## 2023-09-20 MED ORDER — VALSARTAN 40 MG PO TABS: 40.0000 mg | ORAL_TABLET | Freq: Every day | ORAL | 1 refills | Status: DC

## 2023-09-20 MED ORDER — CHLORTHALIDONE 25 MG PO TABS: 25.0000 mg | ORAL_TABLET | Freq: Every day | ORAL | 1 refills | Status: AC

## 2023-09-20 MED ORDER — HYDRALAZINE HCL 50 MG PO TABS: 50.0000 mg | ORAL_TABLET | Freq: Three times a day (TID) | ORAL | 1 refills | Status: AC

## 2023-09-20 MED ORDER — AMLODIPINE BESYLATE 10 MG PO TABS: 10.0000 mg | ORAL_TABLET | Freq: Every day | ORAL | 1 refills | Status: DC

## 2023-09-20 NOTE — Patient Instructions (Addendum)
 Annual exam early December, call if you need me sooner.  Nurse visit for blood pressure check in 1 week.  It is vital that you take your medication regularly as prescribed so that your blood pressure will be controlled  Phentermine  37.5 mg half a tablet 3 times weekly will be prescribed once your blood pressure is within a healthy range.  Please commit to stopping smoking.  Thankful that all of your cancer screening tests have come back normal.  No prednisone  is prescribed for ankle pain as youi report that it causes you have palpitations  Tylenol  is the only safe  medication you can use fr pain due to chronic kidney disease  It is important that you exercise regularly at least 30 minutes 5 times a week. If you develop chest pain, have severe difficulty breathing, or feel very tired, stop exercising immediately and seek medical attention   Thanks for choosing [facility], we consider it a privelige to serve you.

## 2023-09-20 NOTE — Assessment & Plan Note (Signed)
 Closely followed by Nephrology

## 2023-09-20 NOTE — Assessment & Plan Note (Signed)
 Uncontrolled , taking hydralazine  twice daily, will change that DASH diet and commitment to daily physical activity for a minimum of 30 minutes discussed and encouraged, as a part of hypertension management. The importance of attaining a healthy weight is also discussed.     09/20/2023    9:43 AM 09/20/2023    9:00 AM 08/08/2023   12:13 PM 08/02/2023    9:47 AM 08/02/2023    9:40 AM 08/02/2023    8:04 AM 07/29/2023   10:00 AM  BP/Weight  Systolic BP 148 149 124 143 111 156   Diastolic BP 88 85 78 84 68 83   Wt. (Lbs)  162.04 165   166.67 166.67  BMI  28.7 kg/m2 29.23 kg/m2   29.52 kg/m2 29.52 kg/m2

## 2023-09-20 NOTE — Progress Notes (Signed)
 Christina Williamson     MRN: [id number]      DOB: 26-Dec-1955  Chief Complaint  Patient presents with   Hypertension    4 month follow up     HPI Christina Williamson is here for follow up and re-evaluation of chronic medical conditions, medication management and review of any available recent lab and radiology data.  Preventive health is updated, specifically  Cancer screening and Immunization.   Questions or concerns regarding consultations or procedures which the PT has had in the interim are  addressed. The PT denies any adverse reactions to current medications since the last visit.  There are no new concerns.  There are no specific complaints   ROS Denies recent fever or chills. Denies sinus pressure, nasal congestion, ear pain or sore throat. Denies chest congestion, productive cough or wheezing. Denies chest pains, palpitations and leg swelling Denies abdominal pain, nausea, vomiting,diarrhea or constipation.   Denies dysuria, frequency, hesitancy or incontinence. Denies joint pain, swelling and limitation in mobility. Denies headaches, seizures, numbness, or tingling. Denies depression, anxiety or insomnia. Denies skin break down or rash.   PE  BP (!) 148/88   Pulse 74   Resp 16   Ht 5' 3 (1.6 m)   Wt 162 lb 0.6 oz (73.5 kg)   SpO2 98%   BMI 28.70 kg/m   Patient alert and oriented and in no cardiopulmonary distress.  HEENT: No facial asymmetry, EOMI,     Neck supple .  Chest: Clear to auscultation bilaterally.  CVS: S1, S2 no murmurs, no S3.Regular rate.  ABD: Soft non tender.   Ext: No edema  MS: Adequate ROM spine, shoulders, hips and knees.  Skin: Intact, no ulcerations or rash noted.  Psych: Good eye contact, normal affect. Memory intact not anxious or depressed appearing.  CNS: CN 2-12 intact, power,  normal throughout.no focal deficits noted.   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 The importance of attaining a healthy weight is also discussed.     09/20/2023    9:43 AM 09/20/2023    9:00 AM 08/08/2023   12:13 PM 08/02/2023    9:47 AM 08/02/2023    9:40 AM 08/02/2023    8:04 AM 07/29/2023   10:00 AM  BP/Weight  Systolic BP 148 149 124 143 111 156   Diastolic BP 88 85 78 84 68 83   Wt. (Lbs)  162.04 165   166.67 166.67  BMI  28.7 kg/m2 29.23 kg/m2   29.52 kg/m2 29.52 kg/m2       Overweight (BMI 25.0-29.9)  Patient re-educated about  the importance of commitment to a  minimum of 150 minutes of exercise per week as able.  The importance of healthy food choices with portion control discussed, as well as eating regularly and within a 12 hour window most days. The need to choose clean , green food 50 to 75% of the time is discussed, as well as to make water  the primary drink and set a goal of 64 ounces water  daily.       09/20/2023    9:00 AM 08/08/2023   12:13 PM 08/02/2023    8:04 AM  Weight /BMI  Weight 162 lb 0.6 oz 165 lb 166 lb 10.7 oz  Height 5' 3 (1.6 m) 5' 3 (1.6 m) 5' 3 (1.6 m)  BMI 28.7 kg/m2 29.23 kg/m2 29.52 kg/m2    Whe BP conttrolled will start phentermine  37.5, half tab 3 time  weekly as needed  NICOTINE  ADDICTION Asked:confirms currently smokes cigarettes, 1 to 3 per day Assess: Unwilling to set a quit date,states  I just smoke after I eat Advise: needs to QUIT to reduce risk of cancer, cardio and cerebrovascular disease Assist: counseled for 5 minutes and literature provided Arrange: follow up in 2 to 4 months   Hyperuricemia Managed by Nephrology  CKD (chronic kidney disease) stage 4, GFR 15-29 ml/min (HCC) Closely followed by Nephrology

## 2023-09-20 NOTE — Assessment & Plan Note (Signed)
  Patient re-educated about  the importance of commitment to a  minimum of 150 minutes of exercise per week as able.  The importance of healthy food choices with portion control discussed, as well as eating regularly and within a 12 hour window most days. The need to choose clean , green food 50 to 75% of the time is discussed, as well as to make water  the primary drink and set a goal of 64 ounces water  daily.       09/20/2023    9:00 AM 08/08/2023   12:13 PM 08/02/2023    8:04 AM  Weight /BMI  Weight 162 lb 0.6 oz 165 lb 166 lb 10.7 oz  Height 5' 3 (1.6 m) 5' 3 (1.6 m) 5' 3 (1.6 m)  BMI 28.7 kg/m2 29.23 kg/m2 29.52 kg/m2    Whe BP conttrolled will start phentermine  37.5, half tab 3 time  weekly as needed

## 2023-09-20 NOTE — Assessment & Plan Note (Signed)
 Managed by Nephrology

## 2023-09-20 NOTE — Assessment & Plan Note (Signed)
 Asked:confirms currently smokes cigarettes, 1 to 3 per day Assess: Unwilling to set a quit date,states  I just smoke after I eat Advise: needs to QUIT to reduce risk of cancer, cardio and cerebrovascular disease Assist: counseled for 5 minutes and literature provided Arrange: follow up in 2 to 4 months

## 2023-09-27 ENCOUNTER — Ambulatory Visit

## 2023-09-27 DIAGNOSIS — I129 Hypertensive chronic kidney disease with stage 1 through stage 4 chronic kidney disease, or unspecified chronic kidney disease: Secondary | ICD-10-CM

## 2023-09-27 NOTE — Progress Notes (Signed)
 Patient is in office today for a nurse visit for Blood Pressure Check. Patient blood pressure was 146/84, Patient No chest pain, No shortness of breath, No dyspnea on exertion, No orthopnea, No paroxysmal nocturnal dyspnea, No edema, No palpitations, No syncope

## 2023-10-10 ENCOUNTER — Ambulatory Visit: Payer: Self-pay

## 2023-12-14 ENCOUNTER — Encounter (INDEPENDENT_AMBULATORY_CARE_PROVIDER_SITE_OTHER): Payer: Self-pay | Admitting: Gastroenterology

## 2023-12-22 ENCOUNTER — Other Ambulatory Visit: Payer: Self-pay | Admitting: Family Medicine

## 2023-12-22 DIAGNOSIS — E790 Hyperuricemia without signs of inflammatory arthritis and tophaceous disease: Secondary | ICD-10-CM

## 2024-02-07 ENCOUNTER — Encounter: Admitting: Family Medicine

## 2024-03-16 ENCOUNTER — Other Ambulatory Visit: Payer: Self-pay | Admitting: Family Medicine

## 2024-03-29 ENCOUNTER — Other Ambulatory Visit: Payer: Self-pay | Admitting: Family Medicine

## 2024-03-29 ENCOUNTER — Ambulatory Visit: Payer: Self-pay

## 2024-03-29 ENCOUNTER — Ambulatory Visit: Admitting: Family Medicine

## 2024-03-29 DIAGNOSIS — E790 Hyperuricemia without signs of inflammatory arthritis and tophaceous disease: Secondary | ICD-10-CM

## 2024-03-29 NOTE — Telephone Encounter (Signed)
 Pt calling in because her virtual appointment was changed to in person, she is stating she cannot come in because all of the ice on her road. She is just requesting to speak to Dr. Antonetta, advised Pt Dr. Antonetta is currently seeing patient's, and she needs an appointment to be evaluated by the doctor.

## 2024-03-29 NOTE — Telephone Encounter (Signed)
 I RECOMMEND OTC ROBITUSSIN dm 1 TEASPOON EVERY  8 HURS AS NEEDED, AND CLARITIN ONE DAILY FOR ALLERGIES IF SHE GET FEVER , CHILLS , FEELING WEAK OR EXCESS COUGH NEEDS EVALUATION

## 2024-03-29 NOTE — Telephone Encounter (Signed)
 Pt informed

## 2024-03-29 NOTE — Telephone Encounter (Signed)
 See previous documentation in chart from today.

## 2024-03-29 NOTE — Telephone Encounter (Signed)
Noted patient scheduled

## 2024-03-29 NOTE — Telephone Encounter (Signed)
 FYI Only or Action Required?: FYI only for provider: VV scheduled for this morning.  Patient was last seen in primary care on 09/20/2023 by Antonetta Rollene BRAVO, MD.  Called Nurse Triage reporting Cough. - wet cough, wheezing  Symptoms began several days ago.  Interventions attempted: OTC medications: cough medications.  Symptoms are: gradually worsening.  Triage Disposition: See HCP Within 4 Hours (Or PCP Triage)  Patient/caregiver understands and will follow disposition?: Yes                            Reason for Triage: chest congestion, cough w/ discolored mucous   Reason for Disposition  Wheezing is present  Answer Assessment - Initial Assessment Questions 1. ONSET: When did the cough begin?      Monday 2. SEVERITY: How bad is the cough today?      Moderate 3. SPUTUM: Describe the color of your sputum (e.g., none, dry cough; clear, white, yellow, green)     green 4. HEMOPTYSIS: Are you coughing up any blood? If Yes, ask: How much? (e.g., flecks, streaks, tablespoons, etc.)     no 5. DIFFICULTY BREATHING: Are you having difficulty breathing? If Yes, ask: How bad is it? (e.g., mild, moderate, severe)      wheezing 6. FEVER: Do you have a fever? If Yes, ask: What is your temperature, how was it measured, and when did it start?     Fever has resolved 7. CARDIAC HISTORY: Do you have any history of heart disease? (e.g., heart attack, congestive heart failure)      no 8. LUNG HISTORY: Do you have any history of lung disease?  (e.g., pulmonary embolus, asthma, emphysema)     no  10. OTHER SYMPTOMS: Do you have any other symptoms? (e.g., runny nose, wheezing, chest pain)       Wheezing, congestion, sinus and chest congestion  Protocols used: Cough - Acute Productive-A-AH

## 2024-04-24 ENCOUNTER — Encounter: Admitting: Family Medicine

## 2024-05-28 ENCOUNTER — Ambulatory Visit
